# Patient Record
Sex: Female | Born: 1950 | Race: White | Hispanic: No | Marital: Married | Smoking: Never smoker
Health system: Southern US, Community
[De-identification: ages and names within clinical notes are randomized; demographics above are authoritative.]

## PROBLEM LIST (undated history)

## (undated) DIAGNOSIS — Z87442 Personal history of urinary calculi: Secondary | ICD-10-CM

## (undated) DIAGNOSIS — K219 Gastro-esophageal reflux disease without esophagitis: Secondary | ICD-10-CM

## (undated) DIAGNOSIS — I10 Essential (primary) hypertension: Secondary | ICD-10-CM

## (undated) DIAGNOSIS — J45909 Unspecified asthma, uncomplicated: Secondary | ICD-10-CM

## (undated) DIAGNOSIS — F419 Anxiety disorder, unspecified: Secondary | ICD-10-CM

## (undated) HISTORY — PX: APPENDECTOMY: SHX54

## (undated) HISTORY — PX: TONSILLECTOMY: SUR1361

---

## 1982-11-10 HISTORY — PX: TUBAL LIGATION: SHX77

## 2016-06-03 ENCOUNTER — Encounter (INDEPENDENT_AMBULATORY_CARE_PROVIDER_SITE_OTHER): Payer: Self-pay | Admitting: *Deleted

## 2016-06-18 ENCOUNTER — Encounter (INDEPENDENT_AMBULATORY_CARE_PROVIDER_SITE_OTHER): Payer: Self-pay | Admitting: *Deleted

## 2016-06-19 ENCOUNTER — Other Ambulatory Visit (INDEPENDENT_AMBULATORY_CARE_PROVIDER_SITE_OTHER): Payer: Self-pay | Admitting: *Deleted

## 2016-06-19 DIAGNOSIS — Z800 Family history of malignant neoplasm of digestive organs: Secondary | ICD-10-CM

## 2016-09-08 ENCOUNTER — Telehealth (INDEPENDENT_AMBULATORY_CARE_PROVIDER_SITE_OTHER): Payer: Self-pay | Admitting: *Deleted

## 2016-09-08 ENCOUNTER — Encounter (INDEPENDENT_AMBULATORY_CARE_PROVIDER_SITE_OTHER): Payer: Self-pay | Admitting: *Deleted

## 2016-09-08 MED ORDER — PEG 3350-KCL-NA BICARB-NACL 420 G PO SOLR: 4000.0000 mL | Freq: Once | ORAL | 0 refills | Status: AC

## 2016-09-08 NOTE — Telephone Encounter (Signed)
agree

## 2016-09-08 NOTE — Telephone Encounter (Signed)
Patient needs trilyte 

## 2016-09-08 NOTE — Telephone Encounter (Signed)
Referring MD/PCP: pomposini   Procedure: tcs  Reason/Indication:  Fam hx colon ca  Has patient had this procedure before?  Yes, 5 yrs ago  If so, when, by whom and where?    Is there a family history of colon cancer?  Yes, father  Who?  What age when diagnosed?    Is patient diabetic?   no      Does patient have prosthetic heart valve or mechanical valve?  no  Do you have a pacemaker?  no  Has patient ever had endocarditis? no  Has patient had joint replacement within last 12 months?  no  Does patient tend to be constipated or take laxatives? no  Does patient have a history of alcohol/drug use?  no  Is patient on Coumadin, Plavix and/or Aspirin? no  Medications: omeprazole 20 mg daily, multi vit  Allergies: sulfur drugs  Medication Adjustment:   Procedure date & time: 10/08/16 at 930

## 2016-10-08 ENCOUNTER — Encounter (HOSPITAL_COMMUNITY): Payer: Self-pay | Admitting: *Deleted

## 2016-10-08 ENCOUNTER — Ambulatory Visit (HOSPITAL_COMMUNITY)
Admission: RE | Admit: 2016-10-08 | Discharge: 2016-10-08 | Disposition: A | Discharge status: Home / Self Care | Payer: BLUE CROSS/BLUE SHIELD | Source: Ambulatory Visit | Attending: Internal Medicine | Admitting: Internal Medicine

## 2016-10-08 ENCOUNTER — Encounter (HOSPITAL_COMMUNITY)
Admission: RE | Disposition: A | Discharge status: Home / Self Care | Payer: Self-pay | Source: Ambulatory Visit | Attending: Internal Medicine

## 2016-10-08 DIAGNOSIS — Z1211 Encounter for screening for malignant neoplasm of colon: Secondary | ICD-10-CM | POA: Insufficient documentation

## 2016-10-08 DIAGNOSIS — Z800 Family history of malignant neoplasm of digestive organs: Secondary | ICD-10-CM | POA: Insufficient documentation

## 2016-10-08 DIAGNOSIS — Z79899 Other long term (current) drug therapy: Secondary | ICD-10-CM | POA: Diagnosis not present

## 2016-10-08 HISTORY — PX: COLONOSCOPY: SHX5424

## 2016-10-08 SURGERY — COLONOSCOPY
Anesthesia: Moderate Sedation

## 2016-10-08 MED ORDER — MEPERIDINE HCL 50 MG/ML IJ SOLN
INTRAMUSCULAR | Status: DC | PRN
  Administered 2016-10-08 (×4): 25 mg via INTRAVENOUS

## 2016-10-08 MED ORDER — MEPERIDINE HCL 50 MG/ML IJ SOLN
INTRAMUSCULAR | Status: DC
Start: 2016-10-08 — End: 2016-10-08
  Filled 2016-10-08: qty 1

## 2016-10-08 MED ORDER — MEPERIDINE HCL 50 MG/ML IJ SOLN
INTRAMUSCULAR | Status: AC
  Filled 2016-10-08: qty 1

## 2016-10-08 MED ORDER — MIDAZOLAM HCL 5 MG/5ML IJ SOLN
INTRAMUSCULAR | Status: AC
  Filled 2016-10-08: qty 10

## 2016-10-08 MED ORDER — MIDAZOLAM HCL 5 MG/5ML IJ SOLN
INTRAMUSCULAR | Status: DC | PRN
  Administered 2016-10-08 (×5): 2 mg via INTRAVENOUS

## 2016-10-08 MED ORDER — SODIUM CHLORIDE 0.9 % IV SOLN
INTRAVENOUS | Status: DC
  Administered 2016-10-08: 1000 mL via INTRAVENOUS

## 2016-10-08 NOTE — Discharge Instructions (Signed)
Resume usual medications and diet. °No driving for 24 hours. °Next colonoscopy in 5 years. ° ° ° ° ° ° °Colonoscopy, Adult, Care After °This sheet gives you information about how to care for yourself after your procedure. Your doctor may also give you more specific instructions. If you have problems or questions, call your doctor. °Follow these instructions at home: °General instructions ° °· For the first 24 hours after the procedure: °? Do not drive or use machinery. °? Do not sign important documents. °? Do not drink alcohol. °? Do your daily activities more slowly than normal. °? Eat foods that are soft and easy to digest. °? Rest often. °· Take over-the-counter or prescription medicines only as told by your doctor. °· It is up to you to get the results of your procedure. Ask your doctor, or the department performing the procedure, when your results will be ready. °To help cramping and bloating: °· Try walking around. °· Put heat on your belly (abdomen) as told by your doctor. Use a heat source that your doctor recommends, such as a moist heat pack or a heating pad. °? Put a towel between your skin and the heat source. °? Leave the heat on for 20-30 minutes. °? Remove the heat if your skin turns bright red. This is especially important if you cannot feel pain, heat, or cold. You can get burned. °Eating and drinking °· Drink enough fluid to keep your pee (urine) clear or pale yellow. °· Return to your normal diet as told by your doctor. Avoid heavy or fried foods that are hard to digest. °· Avoid drinking alcohol for as long as told by your doctor. °Contact a doctor if: °· You have blood in your poop (stool) 2-3 days after the procedure. °Get help right away if: °· You have more than a small amount of blood in your poop. °· You see large clumps of tissue (blood clots) in your poop. °· Your belly is swollen. °· You feel sick to your stomach (nauseous). °· You throw up (vomit). °· You have a fever. °· You have  belly pain that gets worse, and medicine does not help your pain. °This information is not intended to replace advice given to you by your health care provider. Make sure you discuss any questions you have with your health care provider. °Document Released: 11/29/2010 Document Revised: 07/21/2016 Document Reviewed: 07/21/2016 °Elsevier Interactive Patient Education © 2017 [employer] ° °

## 2016-10-08 NOTE — Op Note (Signed)
[facility] Patient Name: Caroline May Procedure Date: 10/08/2016 9:12 AM MRN: [id number] Date of Birth: Feb 08, 1951 Attending MD: Hildred Laser , MD CSN: [id number] Age: 65 Admit Type: Outpatient Procedure:                Colonoscopy Indications:              Screening patient at increased risk: Family history                            of 1st-degree relative with colorectal cancer at                            age 70 years (or older) Providers:                Hildred Laser, MD, Lurline Del, RN, Purcell Nails. [place],                            [employer] officer Referring MD:             Cherly Anderson. Pomposini, MD Medicines:                Meperidine 100 mg IV, Midazolam 10 mg IV Complications:            No immediate complications. Estimated Blood Loss:     Estimated blood loss: none. Procedure:                Pre-Anesthesia Assessment:                           - Prior to the procedure, a History and Physical                            was performed, and patient medications and                            allergies were reviewed. The patient's tolerance of                            previous anesthesia was also reviewed. The risks                            and benefits of the procedure and the sedation                            options and risks were discussed with the patient.                            All questions were answered, and informed consent                            was obtained. Prior Anticoagulants: The patient has                            taken no previous anticoagulant or antiplatelet  agents. ASA Grade Assessment: I - A normal, healthy                            patient. After reviewing the risks and benefits,                            the patient was deemed in satisfactory condition to                            undergo the procedure.                           After obtaining informed consent, the colonoscope                            was passed  under direct vision. Throughout the                            procedure, the patient's blood pressure, pulse, and                            oxygen saturations were monitored continuously. The                            [id number]) scope was introduced through                            the anus and advanced to the the cecum, identified                            by appendiceal orifice and ileocecal valve. The                            colonoscopy was technically difficult and complex                            due to significant looping and a tortuous colon.                            Successful completion of the procedure was aided by                            increasing the dose of sedation medication,                            changing the patient to a supine position, using                            manual pressure and withdrawing the scope and                            replacing with the 'babyscope'. The patient  tolerated the procedure well. The quality of the                            bowel preparation was good. The ileocecal valve,                            appendiceal orifice, and rectum were photographed. Scope In: 9:30:01 AM Scope Out: 10:27:36 AM Scope Withdrawal Time: 0 hours 6 minutes 14 seconds  Total Procedure Duration: 0 hours 57 minutes 35 seconds  Findings:      The perianal and digital rectal examinations were normal.      The colon (entire examined portion) appeared normal.      The retroflexed view of the distal rectum and anal verge was normal and       showed no anal or rectal abnormalities. Impression:               - The entire examined colon is normal.                           - No specimens collected. Moderate Sedation:      Moderate (conscious) sedation was administered by the endoscopy nurse       and supervised by the endoscopist. The following parameters were       monitored: oxygen saturation, heart rate,  blood pressure, CO2       capnography and response to care. Total physician intraservice time was       67 minutes. Recommendation:           - Patient has a contact number available for                            emergencies. The signs and symptoms of potential                            delayed complications were discussed with the                            patient. Return to normal activities tomorrow.                            Written discharge instructions were provided to the                            patient.                           - Resume previous diet today.                           - Continue present medications.                           - Repeat colonoscopy in 5 years for screening                            purposes.                           -  will use [facility] for next procedure. Procedure Code(s):        --- Professional ---                           [phone], Colonoscopy, flexible; diagnostic, including                            collection of specimen(s) by brushing or washing,                            when performed (separate procedure)                           99152, Moderate sedation services provided by the                            same physician or other qualified health care                            professional performing the diagnostic or                            therapeutic service that the sedation supports,                            requiring the presence of an independent trained                            observer to assist in the monitoring of the                            patient's level of consciousness and physiological                            status; initial 15 minutes of intraservice time,                            patient age 36 years or older                           [phone], Moderate sedation services; each additional                            15 minutes intraservice time                           99153, Moderate sedation  services; each additional                            15 minutes intraservice time                           99153, Moderate sedation services; each additional  15 minutes intraservice time Diagnosis Code(s):        --- Professional ---                           Z80.0, Family history of malignant neoplasm of                            digestive organs CPT copyright 2016 American Medical Association. All rights reserved. The codes documented in this report are preliminary and upon coder review may  be revised to meet current compliance requirements. Hildred Laser, MD Hildred Laser, MD 10/08/2016 10:36:45 AM This report has been signed electronically. Number of Addenda: 0

## 2016-10-08 NOTE — H&P (Signed)
Caroline May is an 65 y.o. female.   Chief Complaint: Patient is here for colonoscopy. HPI: Patient is 65 year old Caucasian female was a fire screening colonoscopy. Her stool colonoscopy 7 normal. Last exam was 5 years ago. She denies rectal bleeding. She has occasional fleeting pain at [facility] and wonders if she has diverticulosis. She has never been treated for diverticulitis. Her brother had 1 polyp removed. Father was diagnosed with CRC in his early 38s and died of MI at age 22.  History reviewed. No pertinent past medical history.  Past Surgical History:  Procedure Laterality Date  . TONSILLECTOMY    . TUBAL LIGATION  1984    Family History  Problem Relation Age of Onset  . Heart attack Mother   . Colon cancer Father   . Heart attack Father   . Breast cancer Sister   . Alzheimer's disease Sister   . Parkinson's disease Brother    Social History:  reports that she has never smoked. She has never used smokeless tobacco. She reports that she drinks alcohol. She reports that she does not use drugs.  Allergies:  Allergies  Allergen Reactions  . Sulfa Antibiotics Other (See Comments)    Causes the inside of mouth to become irritated     Medications Prior to Admission  Medication Sig Dispense Refill  . Calcium Carb-Cholecalciferol (CALCIUM 600 + D PO) Take 1 tablet by mouth daily.    . cetirizine (ZYRTEC) 10 MG tablet Take 10 mg by mouth daily as needed for allergies.    . Cholecalciferol (VITAMIN D) 2000 units tablet Take 2,000 Units by mouth daily.    . Multiple Vitamins-Minerals (MULTIVITAMIN WITH MINERALS) tablet Take 1 tablet by mouth daily.    . Omega-3 Krill Oil 1000 MG CAPS Take 1 capsule by mouth daily.    Marland Kitchen omeprazole (PRILOSEC) 20 MG capsule Take 20 mg by mouth daily.    . Probiotic Product (PROBIOTIC DAILY PO) Take 1 capsule by mouth daily.      No results found for this or any previous visit (from the past 48 hour(s)). No results found.  ROS  Blood pressure (!)  141/83, pulse 88, temperature 97.9 F (36.6 C), temperature source Oral, resp. rate 19, height 5\' 5"  (1.651 m), weight 138 lb (62.6 kg), SpO2 96 %. Physical Exam  Constitutional: She appears well-developed and well-nourished.  HENT:  Mouth/Throat: Oropharynx is clear and moist.  Eyes: Conjunctivae are normal. No scleral icterus.  Neck: No thyromegaly present.  Cardiovascular: Normal rate, regular rhythm and normal heart sounds.   No murmur heard. Respiratory: Effort normal and breath sounds normal.  GI: Soft. She exhibits no distension and no mass. There is no tenderness.  Musculoskeletal: She exhibits no edema.  Lymphadenopathy:    She has no cervical adenopathy.  Neurological: She is alert.  Skin: Skin is warm and dry.     Assessment/Plan High risk screening colonoscopy.  Hildred Laser, MD 10/08/2016, 9:17 AM

## 2016-10-10 ENCOUNTER — Encounter (HOSPITAL_COMMUNITY): Payer: Self-pay | Admitting: Internal Medicine

## 2020-12-24 ENCOUNTER — Encounter (INDEPENDENT_AMBULATORY_CARE_PROVIDER_SITE_OTHER): Payer: Self-pay | Admitting: *Deleted

## 2021-03-21 ENCOUNTER — Ambulatory Visit (INDEPENDENT_AMBULATORY_CARE_PROVIDER_SITE_OTHER): Payer: Medicare Other | Admitting: Gastroenterology

## 2021-05-09 ENCOUNTER — Other Ambulatory Visit: Payer: Self-pay

## 2021-05-09 ENCOUNTER — Encounter (INDEPENDENT_AMBULATORY_CARE_PROVIDER_SITE_OTHER): Payer: Self-pay | Admitting: Gastroenterology

## 2021-05-09 ENCOUNTER — Ambulatory Visit (INDEPENDENT_AMBULATORY_CARE_PROVIDER_SITE_OTHER): Payer: Medicare Other | Admitting: Gastroenterology

## 2021-05-09 VITALS — BP 118/71 | HR 71 | Temp 98.8°F | Ht 65.0 in | Wt 141.0 lb

## 2021-05-09 DIAGNOSIS — K5904 Chronic idiopathic constipation: Secondary | ICD-10-CM | POA: Diagnosis not present

## 2021-05-09 DIAGNOSIS — K5900 Constipation, unspecified: Secondary | ICD-10-CM | POA: Insufficient documentation

## 2021-05-09 DIAGNOSIS — Z800 Family history of malignant neoplasm of digestive organs: Secondary | ICD-10-CM

## 2021-05-09 NOTE — Progress Notes (Signed)
[name], M.D. Gastroenterology & Hepatology [facility] [address] [place], [place] [address] Primary Care Physician: Pomposini, Cherly Anderson, MD No address on file  Referring MD: PCP  Chief Complaint: Constipation and abdominal discomfort  History of Present Illness: Caroline May is a 70 y.o. female with past medical history of hypertension and urolithiasis, who presents for evaluation of constipation and abdominal discomfort.  The patient reports that she has a BM every 1-2 days. She was previously taking probiotics daily to have a bowel movement. She denies having abdominal pain in her abdomen, but states that she gets early satiety when she gets consitpated. She drinks water frequently. She eats prunes as needed when she is constipated, which makes her have a BM next day. Denies taking laxatives.  She reported she was having some discomfort 6 months ago in her abdomen but that has improved.  She is currently having more regular bowel movements after implementing increase water intake, is no longer having abdominal discomfort.  Patient reports that as part of the evaluation of urinary stones, she had some CT scans and was told she had significant stool in her bowel.  This made her concerned as she did not know if this was something she had to get treatment for.  States that she took some probiotics to improve her bowel movements but did not notice any difference.  The patient denies having any nausea, vomiting, fever, chills, hematochezia, melena, hematemesis, abdominal distention, diarrhea, jaundice, pruritus or weight loss.  I reviewed some investigations performed by her PCP in the past which included an abdominal ultrasound performed on 11/14/2020 which showed nonobstructive calculus in the left kidney without any other alterations.  She had blood testing performed 12/17/2020 which showed a normal BMP with a glucose of 121 but  otherwise her renal function and electrolytes were within normal limits.  Last EGD: Never Last Colonoscopy:2017 - normal  FHx: neg for any gastrointestinal/liver disease,  Patient states her father had colon cancer in his 59s, had only colonic resection. Social: neg smoking, alcohol or illicit drug use Surgical: no abdominal surgeries  Past Medical History:History reviewed. No pertinent past medical history.  Past Surgical History: Past Surgical History:  Procedure Laterality Date   COLONOSCOPY N/A 10/08/2016   Procedure: COLONOSCOPY;  Surgeon: Rogene Houston, MD;  Location: [facility];  Service: Endoscopy;  Laterality: N/A;  13   TONSILLECTOMY     TUBAL LIGATION  1984    Family History: Family History  Problem Relation Age of Onset   Heart attack Mother    Colon cancer Father    Heart attack Father    Breast cancer Sister    Alzheimer's disease Sister    Parkinson's disease Brother     Social History: Social History   Tobacco Use  Smoking Status Never  Smokeless Tobacco Never   Social History   Substance and Sexual Activity  Alcohol Use Yes   Comment: occasional glass of wine   Social History   Substance and Sexual Activity  Drug Use No    Allergies: Allergies  Allergen Reactions   Sulfa Antibiotics Other (See Comments)    Causes the inside of mouth to become irritated     Medications: Current Outpatient Medications  Medication Sig Dispense Refill   atorvastatin (LIPITOR) 20 MG tablet Take 20 mg by mouth daily.     Biotin 1000 MCG CHEW Chew 2,500 mg by mouth daily at 6 (six) AM.  cetirizine (ZYRTEC) 10 MG tablet Take 10 mg by mouth daily as needed for allergies.     Cholecalciferol (VITAMIN D) 2000 units tablet Take 2,000 Units by mouth daily.     losartan (COZAAR) 25 MG tablet Take 50 mg by mouth at bedtime.     Multiple Vitamins-Minerals (MULTIVITAMIN WITH MINERALS) tablet Take 1 tablet by mouth daily.     omeprazole (PRILOSEC) 20 MG  capsule Take 20 mg by mouth daily.     Probiotic Product (PROBIOTIC DAILY PO) Take 1 capsule by mouth daily. (Patient not taking: Reported on 05/09/2021)     No current facility-administered medications for this visit.    Review of Systems: GENERAL: negative for malaise, night sweats HEENT: No changes in hearing or vision, no nose bleeds or other nasal problems. NECK: Negative for lumps, goiter, pain and significant neck swelling RESPIRATORY: Negative for cough, wheezing CARDIOVASCULAR: Negative for chest pain, leg swelling, palpitations, orthopnea GI: SEE HPI MUSCULOSKELETAL: Negative for joint pain or swelling, back pain, and muscle pain. SKIN: Negative for lesions, rash PSYCH: Negative for sleep disturbance, mood disorder and recent psychosocial stressors. HEMATOLOGY Negative for prolonged bleeding, bruising easily, and swollen nodes. ENDOCRINE: Negative for cold or heat intolerance, polyuria, polydipsia and goiter. NEURO: negative for tremor, gait imbalance, syncope and seizures. The remainder of the review of systems is noncontributory.   Physical Exam: BP 118/71 (BP Location: Right Arm, Patient Position: Sitting, Cuff Size: Small)   Pulse 71   Temp 98.8 F (37.1 C) (Oral)   Ht 5\' 5"  (1.651 m)   Wt 141 lb (64 kg)   BMI 23.46 kg/m  GENERAL: The patient is AO x3, in no acute distress. HEENT: Head is normocephalic and atraumatic. EOMI are intact. Mouth is well hydrated and without lesions. NECK: Supple. No masses LUNGS: Clear to auscultation. No presence of rhonchi/wheezing/rales. Adequate chest expansion HEART: RRR, normal s1 and s2. ABDOMEN: Soft, nontender, no guarding, no peritoneal signs, and nondistended. BS +. No masses. EXTREMITIES: Without any cyanosis, clubbing, rash, lesions or edema. NEUROLOGIC: AOx3, no focal motor deficit. SKIN: no jaundice, no rashes   Imaging/Labs: as above  I personally reviewed and interpreted the available labs, imaging and endoscopic  files.  Impression and Plan: Caroline May is a 70 y.o. female with past medical history of hypertension and urolithiasis, who presents for evaluation of constipation and abdominal discomfort.  The patient has presented chronic issues with constipation that fortunately have improved with dietary changes.  I encouraged her to take dietary fiber such as prunes or kiwi which will help her have more regular bowel movements.  Has not present any other significant symptoms.  Had normal abdominal imaging in the last 6 months.  Given her family history of colon cancer, she will need to have repeat colonoscopy in December 2022.  - Schedule colonoscopy in December 2022 - Eat prune and/or kiwi daily  All questions were answered.      [name], MD Gastroenterology and Hepatology [facility]

## 2021-05-09 NOTE — Patient Instructions (Signed)
Schedule colonoscopy in December 2022 Eat prune and/or kiwi daily

## 2021-05-23 ENCOUNTER — Encounter (INDEPENDENT_AMBULATORY_CARE_PROVIDER_SITE_OTHER): Payer: Self-pay

## 2021-09-04 ENCOUNTER — Encounter (INDEPENDENT_AMBULATORY_CARE_PROVIDER_SITE_OTHER): Payer: Self-pay | Admitting: *Deleted

## 2021-10-01 ENCOUNTER — Other Ambulatory Visit (INDEPENDENT_AMBULATORY_CARE_PROVIDER_SITE_OTHER): Payer: Self-pay

## 2021-10-01 DIAGNOSIS — Z1211 Encounter for screening for malignant neoplasm of colon: Secondary | ICD-10-CM

## 2021-10-01 DIAGNOSIS — Z800 Family history of malignant neoplasm of digestive organs: Secondary | ICD-10-CM

## 2021-10-08 ENCOUNTER — Telehealth (INDEPENDENT_AMBULATORY_CARE_PROVIDER_SITE_OTHER): Payer: Self-pay

## 2021-10-08 ENCOUNTER — Encounter (INDEPENDENT_AMBULATORY_CARE_PROVIDER_SITE_OTHER): Payer: Self-pay

## 2021-10-08 MED ORDER — PEG 3350-KCL-NA BICARB-NACL 420 G PO SOLR: 4000.0000 mL | ORAL | 0 refills | Status: DC

## 2021-10-08 NOTE — Telephone Encounter (Signed)
Referring MD/PCP: Pomposini  Procedure: Tcs  Reason/Indication:  Screening, Fam Hx of Colon Cancer  Has patient had this procedure before?  Yes   If so, when, by whom and where?  09/2016  Is there a family history of colon cancer?  yes  Who?  What age when diagnosed?  Father  Is patient diabetic? If yes, Type 1 or Type 2   no      Does patient have prosthetic heart valve or mechanical valve?  no  Do you have a pacemaker/defibrillator?  no  Has patient ever had endocarditis/atrial fibrillation? no  Does patient use oxygen? no  Has patient had joint replacement within last 12 months?  no  Is patient constipated or do they take laxatives? no  Does patient have a history of alcohol/drug use?  no  Have you had a stroke/heart attack last 6 mths? no  Do you take medicine for weight loss?  no  For female patients,: do you still have your menstrual cycle? no  Is patient on blood thinner such as Coumadin, Plavix and/or Aspirin? no  Medications: losartan 75 mg daily, atorvastatin 20 mg daily, omeprazole 20 mg daily, MVI daily, krill oil 350 mg daily, Vit D 3 daily, Hair/Skin/nails daily  Allergies: sulfur antibiotics  Medication Adjustment per Dr Rehman/ none  Procedure date & time: Wednesday 11/06/21 8:30 am

## 2021-10-08 NOTE — Telephone Encounter (Signed)
Caroline May, CMA  

## 2021-10-29 NOTE — Patient Instructions (Signed)
Caroline May  10/29/2021     @PREFPERIOPPHARMACY @   Your procedure is scheduled on  11/06/2021.   Report to [facility] at  [phone]  A.M.   Call this number if you have problems the morning of surgery:  [phone]   Remember:  Follow the diet and prep instructions given to you by the office.    Take these medicines the morning of surgery with A SIP OF WATER                                        Prilosec.     Do not wear jewelry, make-up or nail polish.  Do not wear lotions, powders, or perfumes, or deodorant.  Do not shave 48 hours prior to surgery.  Men may shave face and neck.  Do not bring valuables to the hospital.  [facility] is not responsible for any belongings or valuables.  Contacts, dentures or bridgework may not be worn into surgery.  Leave your suitcase in the car.  After surgery it may be brought to your room.  For patients admitted to the hospital, discharge time will be determined by your treatment team.  Patients discharged the day of surgery will not be allowed to drive home and must have someone with them for 24 hours.    Special instructions:   DO NOT smoke tobacco or vape for 24 hours before your procedure.  Please read over the following fact sheets that you were given. Anesthesia Post-op Instructions and Care and Recovery After Surgery      Colonoscopy, Adult, Care After This sheet gives you information about how to care for yourself after your procedure. Your health care provider may also give you more specific instructions. If you have problems or questions, contact your health care provider. What can I expect after the procedure? After the procedure, it is common to have: A small amount of blood in your stool for 24 hours after the procedure. Some gas. Mild cramping or bloating of your abdomen. Follow these instructions at home: Eating and drinking  Drink enough fluid to keep your urine pale yellow. Follow instructions from your  health care provider about eating or drinking restrictions. Resume your normal diet as instructed by your health care provider. Avoid heavy or fried foods that are hard to digest. Activity Rest as told by your health care provider. Avoid sitting for a long time without moving. Get up to take short walks every 1-2 hours. This is important to improve blood flow and breathing. Ask for help if you feel weak or unsteady. Return to your normal activities as told by your health care provider. Ask your health care provider what activities are safe for you. Managing cramping and bloating  Try walking around when you have cramps or feel bloated. Apply heat to your abdomen as told by your health care provider. Use the heat source that your health care provider recommends, such as a moist heat pack or a heating pad. Place a towel between your skin and the heat source. Leave the heat on for 20-30 minutes. Remove the heat if your skin turns bright red. This is especially important if you are unable to feel pain, heat, or cold. You may have a greater risk of getting burned. General instructions If you were given a sedative during the procedure, it can affect you for  several hours. Do not drive or operate machinery until your health care provider says that it is safe. For the first 24 hours after the procedure: Do not sign important documents. Do not drink alcohol. Do your regular daily activities at a slower pace than normal. Eat soft foods that are easy to digest. Take over-the-counter and prescription medicines only as told by your health care provider. Keep all follow-up visits as told by your health care provider. This is important. Contact a health care provider if: You have blood in your stool 2-3 days after the procedure. Get help right away if you have: More than a small spotting of blood in your stool. Large blood clots in your stool. Swelling of your abdomen. Nausea or vomiting. A  fever. Increasing pain in your abdomen that is not relieved with medicine. Summary After the procedure, it is common to have a small amount of blood in your stool. You may also have mild cramping and bloating of your abdomen. If you were given a sedative during the procedure, it can affect you for several hours. Do not drive or operate machinery until your health care provider says that it is safe. Get help right away if you have a lot of blood in your stool, nausea or vomiting, a fever, or increased pain in your abdomen. This information is not intended to replace advice given to you by your health care provider. Make sure you discuss any questions you have with your health care provider. Document Revised: 09/02/2019 Document Reviewed: 05/23/2019 Elsevier Patient Education  [place] After This sheet gives you information about how to care for yourself after your procedure. Your health care provider may also give you more specific instructions. If you have problems or questions, contact your health care provider. What can I expect after the procedure? After the procedure, it is common to have: Tiredness. Forgetfulness about what happened after the procedure. Impaired judgment for important decisions. Nausea or vomiting. Some difficulty with balance. Follow these instructions at home: For the time period you were told by your health care provider:   Rest as needed. Do not participate in activities where you could fall or become injured. Do not drive or use machinery. Do not drink alcohol. Do not take sleeping pills or medicines that cause drowsiness. Do not make important decisions or sign legal documents. Do not take care of children on your own. Eating and drinking Follow the diet that is recommended by your health care provider. Drink enough fluid to keep your urine pale yellow. If you vomit: Drink water, juice, or soup when you can drink  without vomiting. Make sure you have little or no nausea before eating solid foods. General instructions Have a responsible adult stay with you for the time you are told. It is important to have someone help care for you until you are awake and alert. Take over-the-counter and prescription medicines only as told by your health care provider. If you have sleep apnea, surgery and certain medicines can increase your risk for breathing problems. Follow instructions from your health care provider about wearing your sleep device: Anytime you are sleeping, including during daytime naps. While taking prescription pain medicines, sleeping medicines, or medicines that make you drowsy. Avoid smoking. Keep all follow-up visits as told by your health care provider. This is important. Contact a health care provider if: You keep feeling nauseous or you keep vomiting. You feel light-headed. You are still sleepy or having trouble  with balance after 24 hours. You develop a rash. You have a fever. You have redness or swelling around the IV site. Get help right away if: You have trouble breathing. You have new-onset confusion at home. Summary For several hours after your procedure, you may feel tired. You may also be forgetful and have poor judgment. Have a responsible adult stay with you for the time you are told. It is important to have someone help care for you until you are awake and alert. Rest as told. Do not drive or operate machinery. Do not drink alcohol or take sleeping pills. Get help right away if you have trouble breathing, or if you suddenly become confused. This information is not intended to replace advice given to you by your health care provider. Make sure you discuss any questions you have with your health care provider. Document Revised: 07/12/2020 Document Reviewed: 09/29/2019 Elsevier Patient Education  2022 [employer].

## 2021-10-31 ENCOUNTER — Encounter (HOSPITAL_COMMUNITY): Payer: Self-pay

## 2021-10-31 ENCOUNTER — Other Ambulatory Visit: Payer: Self-pay

## 2021-10-31 ENCOUNTER — Encounter (HOSPITAL_COMMUNITY)
Admission: RE | Admit: 2021-10-31 | Discharge: 2021-10-31 | Disposition: A | Discharge status: Home / Self Care | Payer: Medicare Other | Source: Ambulatory Visit | Attending: Internal Medicine | Admitting: Internal Medicine

## 2021-10-31 HISTORY — DX: Essential (primary) hypertension: I10

## 2021-10-31 HISTORY — DX: Personal history of urinary calculi: Z87.442

## 2021-10-31 HISTORY — DX: Unspecified asthma, uncomplicated: J45.909

## 2021-10-31 HISTORY — DX: Gastro-esophageal reflux disease without esophagitis: K21.9

## 2021-10-31 HISTORY — DX: Anxiety disorder, unspecified: F41.9

## 2021-11-18 ENCOUNTER — Encounter (HOSPITAL_COMMUNITY)
Admission: RE | Admit: 2021-11-18 | Discharge: 2021-11-18 | Disposition: A | Discharge status: Home / Self Care | Payer: Medicare Other | Source: Ambulatory Visit | Attending: Internal Medicine | Admitting: Internal Medicine

## 2021-11-20 ENCOUNTER — Ambulatory Visit (HOSPITAL_COMMUNITY): Admission: RE | Admit: 2021-11-20 | Payer: Medicare Other | Source: Ambulatory Visit | Admitting: Internal Medicine

## 2021-11-20 ENCOUNTER — Encounter (HOSPITAL_COMMUNITY): Admission: RE | Payer: Self-pay | Source: Ambulatory Visit

## 2021-11-20 SURGERY — COLONOSCOPY WITH PROPOFOL
Anesthesia: Monitor Anesthesia Care

## 2021-12-24 ENCOUNTER — Ambulatory Visit (INDEPENDENT_AMBULATORY_CARE_PROVIDER_SITE_OTHER): Payer: Medicare Other | Admitting: Gastroenterology

## 2021-12-24 ENCOUNTER — Other Ambulatory Visit: Payer: Self-pay

## 2021-12-24 ENCOUNTER — Encounter (INDEPENDENT_AMBULATORY_CARE_PROVIDER_SITE_OTHER): Payer: Self-pay | Admitting: Gastroenterology

## 2021-12-24 VITALS — BP 138/79 | HR 69 | Temp 98.1°F | Ht 65.0 in | Wt 142.7 lb

## 2021-12-24 DIAGNOSIS — K5900 Constipation, unspecified: Secondary | ICD-10-CM | POA: Diagnosis not present

## 2021-12-24 DIAGNOSIS — R1032 Left lower quadrant pain: Secondary | ICD-10-CM

## 2021-12-24 NOTE — Progress Notes (Signed)
Referring Provider: Pomposini, Cherly Anderson, MD Primary Care Physician:  Clinton Quant, MD Primary GI Physician: Jenetta Downer  Chief Complaint  Patient presents with   LLQ pain    Patient here today with complaints of LLQ pain, pain comes and goes. She has some issue with constipation. She eats prunes to help with this. She states since bp medication was increased she has started having the issues.    HPI:   Caroline May is a 71 y.o. female with past medical history of HTN, urolithiasis.   Patient presenting today for LLQ pain and constipation.   Last seen June 2022 for constipation, patient was set up for colonoscopy and advised to eat prune/kiwi daily. She was scheduled for colonoscopy in January 2023, however, had to cancel this due to being sick.   She states that she has had some issues with her BP since her acute illness in early January, had to have her BP medication increased. She states since meds were increased she has had worsening constipation. She is avoiding caffeine, eating a low sodium, low processed food diet. She states that she has been doing some sugar free creamers and drinks with sugar substitutes and questions if these may be causing some of her GI symptoms, though she is without diarrhea. She states that she typically has a BM everyday, however, if she goes a day without having a BM, she will eat some prunes which seem to help. She does have to strain sometimes to have a BM as stools continue to feel hard. She has some intermittent LLQ pain, usually worse during times of worse constipation. She tries to eat a diet high in fruits and veggies. She does drink plenty of water, 4 bottles per day at minimum and gets plenty of exercise throughout the week with scheduled exercise classes. She denies rectal bleeding, melena, nausea, vomiting, weight loss, changes in appetite, reflux symptoms, dysphagia or odynophagia.   Last Colonoscopy:2017 normal  Last  Endoscopy:never  Recommendations:  Needs repeat colonoscopy   Past Medical History:  Diagnosis Date   Anxiety    Asthma    GERD (gastroesophageal reflux disease)    History of kidney stones    Hypertension     Past Surgical History:  Procedure Laterality Date   APPENDECTOMY     COLONOSCOPY N/A 10/08/2016   Procedure: COLONOSCOPY;  Surgeon: Rogene Houston, MD;  Location: [facility];  Service: Endoscopy;  Laterality: N/A;  [place]    Current Outpatient Medications  Medication Sig Dispense Refill   atorvastatin (LIPITOR) 20 MG tablet Take 20 mg by mouth daily.     cetirizine (ZYRTEC) 10 MG tablet Take 10 mg by mouth daily as needed for allergies.     Cholecalciferol (VITAMIN D) 2000 units tablet Take 2,000 Units by mouth daily.     ketoconazole (NIZORAL) 2 % cream Apply 1 application topically daily.     Krill Oil 350 MG CAPS Take 350 mg by mouth daily.     LORazepam (ATIVAN) 0.5 MG tablet Take 0.5 mg by mouth as needed for anxiety.     losartan (COZAAR) 100 MG tablet Take 100 mg by mouth daily.     Multiple Vitamins-Minerals (MULTIVITAMIN WITH MINERALS) tablet Take 1 tablet by mouth daily.     omeprazole (PRILOSEC) 20 MG capsule Take 20 mg by mouth daily.     OVER THE COUNTER MEDICATION Hair Skin and nails once per  day     triamcinolone (NASACORT) 55 MCG/ACT AERO nasal inhaler Place 2 sprays into the nose daily as needed (allergies).     No current facility-administered medications for this visit.    Allergies as of 12/24/2021 - Review Complete 12/24/2021  Allergen Reaction Noted   Sulfa antibiotics Other (See Comments) 10/03/2016   Azithromycin Palpitations 10/23/2021    Family History  Problem Relation Age of Onset   Heart attack Mother    Colon cancer Father    Heart attack Father    Breast cancer Sister    Alzheimer's disease Sister    Parkinson's disease Brother     Social History   Socioeconomic History   Marital  status: Married    Spouse name: Not on file   Number of children: Not on file   Years of education: Not on file   Highest education level: Not on file  Occupational History   Not on file  Tobacco Use   Smoking status: Never   Smokeless tobacco: Never  Vaping Use   Vaping Use: Never used  Substance and Sexual Activity   Alcohol use: Yes    Comment: occasional glass of wine   Drug use: No   Sexual activity: Not on file  Other Topics Concern   Not on file  Social History Narrative   Not on file   Social Determinants of Health   Financial Resource Strain: Not on file  Food Insecurity: Not on file  Transportation Needs: Not on file  Physical Activity: Not on file  Stress: Not on file  Social Connections: Not on file   Review of systems General: negative for malaise, night sweats, fever, chills, weight loss Neck: Negative for lumps, goiter, pain and significant neck swelling Resp: Negative for cough, wheezing, dyspnea at rest CV: Negative for chest pain, leg swelling, palpitations, orthopnea GI: denies melena, hematochezia, nausea, vomiting, diarrhea, dysphagia, odyonophagia, early satiety or unintentional weight loss. +constipation +occasional LLQ pain MSK: Negative for joint pain or swelling, back pain, and muscle pain. Derm: Negative for itching or rash Psych: Denies depression, anxiety, memory loss, confusion. No homicidal or suicidal ideation.  Heme: Negative for prolonged bleeding, bruising easily, and swollen nodes. Endocrine: Negative for cold or heat intolerance, polyuria, polydipsia and goiter. Neuro: negative for tremor, gait imbalance, syncope and seizures. The remainder of the review of systems is noncontributory.  Physical Exam: BP 138/79 (BP Location: Left Arm, Patient Position: Sitting, Cuff Size: Large)    Pulse 69    Temp 98.1 F (36.7 C) (Oral)    Ht 5\' 5"  (1.651 m)    Wt 142 lb 11.2 oz (64.7 kg)    BMI 23.75 kg/m  General:   Alert and oriented. No  distress noted. Pleasant and cooperative.  Head:  Normocephalic and atraumatic. Eyes:  Conjuctiva clear without scleral icterus. Mouth:  Oral mucosa pink and moist. Good dentition. No lesions. Heart: Normal rate and rhythm, s1 and s2 heart sounds present.  Lungs: Clear lung sounds in all lobes. Respirations equal and unlabored. Abdomen:  +BS, soft, non-tender and non-distended. No rebound or guarding. No HSM or masses noted. Derm: No palmar erythema or jaundice Msk:  Symmetrical without gross deformities. Normal posture. Extremities:  Without edema. Neurologic:  Alert and  oriented x4 Psych:  Alert and cooperative. Normal mood and affect.  Invalid input(s): 6 MONTHS   ASSESSMENT: Caroline May is a 71 y.o. female presenting today for ongoing constipation and some occasional LLQ pain.  Patient continues  with constipation, having 1 BM per day usually but having to strain some due to hard stools. She feels that symptoms worsened after her losartan dosage was increased. Occasional LLQ pain, especially during times when she is having worse constipation, abdominal exam reassuringly is benign for any TTP. Patient is exercising and tries to eat a diet high in fruits and veggies. She is drinking plenty of water and eating prunes when she skips a day without a BM. She has no red flag symptoms. She has tried metamucil in the past which caused bloating. I discussed trying otc miralax, or even Rx Linzess or trulance, however, patient does not want to try anything at this time and prefers to attempt to manage her constipation with more natural remedies and diet. I encouraged her to keep a food journal in order to correlate GI symptoms to foods she has eaten. We discussed sugar substitutes and some food additives can certainly cause some unwanted GI symptoms, though typically these would be abdominal cramping/diarrhea. I have also provided low FODMAP diet for her to try as it is possible that she has some  underlying IBS contributing to her constipation. All questions were answered, patient verbalized understanding and is in agreement with plan as outline above.   We will also get her rescheduled for colonoscopy as her BP appears to be stabilized now, however, if she begins having issues with HTN again prior to procedure, this will need to be postponed.   Indications, risks and benefits of procedure discussed in detail with patient. Patient verbalized understanding and is in agreement to proceed with colonoscopy at this time.    PLAN:  Schedule colonoscopy  2. Ensure diet high in fruits, veggies, whole grains, and stay well hydrated 3. Continue prunes and kiwi 4. Can try probiotic lactobacillus rhamnosus 5. Low FODMAP diet 6. Keep food journal x2 weeks to correlate foods and symptoms    Follow Up: 6 months  Antania Hoefling L. Alver Sorrow, MSN, APRN, AGNP-C Adult-Gerontology Nurse Practitioner [facility]

## 2021-12-24 NOTE — Patient Instructions (Signed)
We will get you scheduled for colonoscopy Please continue to eat a diet high in fruits, veggies and whole grains Make sure you are drinking plenty of water, atleast 64 oz per day Continue with prunes or kiwi daily to help with constipation. You can try over the counter probiotic (lactobacillus rhamnosus) to see if this helps with some of your GI symptoms. If you are not having good results with dietary modifications, we may need to discuss trying something such as miralax or a medication for constipation such as trulance or linzess.  Please keep a food journal for the next 2 weeks to keep track of correlations between what you are eating/drinking and your symptoms. I have provided low FODMAP diet as well as this can be helpful to use when trying to cut out more processed foods and additives that can adversely affect your GI tract  Follow up 6 months

## 2021-12-24 NOTE — H&P (View-Only) (Signed)
Referring Provider: Pomposini, Cherly Anderson, MD Primary Care Physician:  Clinton Quant, MD Primary GI Physician: Jenetta Downer  Chief Complaint  Patient presents with   LLQ pain    Patient here today with complaints of LLQ pain, pain comes and goes. She has some issue with constipation. She eats prunes to help with this. She states since bp medication was increased she has started having the issues.    HPI:   Caroline May is a 71 y.o. female with past medical history of HTN, urolithiasis.   Patient presenting today for LLQ pain and constipation.   Last seen June 2022 for constipation, patient was set up for colonoscopy and advised to eat prune/kiwi daily. She was scheduled for colonoscopy in January 2023, however, had to cancel this due to being sick.   She states that she has had some issues with her BP since her acute illness in early January, had to have her BP medication increased. She states since meds were increased she has had worsening constipation. She is avoiding caffeine, eating a low sodium, low processed food diet. She states that she has been doing some sugar free creamers and drinks with sugar substitutes and questions if these may be causing some of her GI symptoms, though she is without diarrhea. She states that she typically has a BM everyday, however, if she goes a day without having a BM, she will eat some prunes which seem to help. She does have to strain sometimes to have a BM as stools continue to feel hard. She has some intermittent LLQ pain, usually worse during times of worse constipation. She tries to eat a diet high in fruits and veggies. She does drink plenty of water, 4 bottles per day at minimum and gets plenty of exercise throughout the week with scheduled exercise classes. She denies rectal bleeding, melena, nausea, vomiting, weight loss, changes in appetite, reflux symptoms, dysphagia or odynophagia.   Last Colonoscopy:2017 normal  Last  Endoscopy:never  Recommendations:  Needs repeat colonoscopy   Past Medical History:  Diagnosis Date   Anxiety    Asthma    GERD (gastroesophageal reflux disease)    History of kidney stones    Hypertension     Past Surgical History:  Procedure Laterality Date   APPENDECTOMY     COLONOSCOPY N/A 10/08/2016   Procedure: COLONOSCOPY;  Surgeon: Rogene Houston, MD;  Location: [facility];  Service: Endoscopy;  Laterality: N/A;  [place]    Current Outpatient Medications  Medication Sig Dispense Refill   atorvastatin (LIPITOR) 20 MG tablet Take 20 mg by mouth daily.     cetirizine (ZYRTEC) 10 MG tablet Take 10 mg by mouth daily as needed for allergies.     Cholecalciferol (VITAMIN D) 2000 units tablet Take 2,000 Units by mouth daily.     ketoconazole (NIZORAL) 2 % cream Apply 1 application topically daily.     Krill Oil 350 MG CAPS Take 350 mg by mouth daily.     LORazepam (ATIVAN) 0.5 MG tablet Take 0.5 mg by mouth as needed for anxiety.     losartan (COZAAR) 100 MG tablet Take 100 mg by mouth daily.     Multiple Vitamins-Minerals (MULTIVITAMIN WITH MINERALS) tablet Take 1 tablet by mouth daily.     omeprazole (PRILOSEC) 20 MG capsule Take 20 mg by mouth daily.     OVER THE COUNTER MEDICATION Hair Skin and nails once per  day     triamcinolone (NASACORT) 55 MCG/ACT AERO nasal inhaler Place 2 sprays into the nose daily as needed (allergies).     No current facility-administered medications for this visit.    Allergies as of 12/24/2021 - Review Complete 12/24/2021  Allergen Reaction Noted   Sulfa antibiotics Other (See Comments) 10/03/2016   Azithromycin Palpitations 10/23/2021    Family History  Problem Relation Age of Onset   Heart attack Mother    Colon cancer Father    Heart attack Father    Breast cancer Sister    Alzheimer's disease Sister    Parkinson's disease Brother     Social History   Socioeconomic History   Marital  status: Married    Spouse name: Not on file   Number of children: Not on file   Years of education: Not on file   Highest education level: Not on file  Occupational History   Not on file  Tobacco Use   Smoking status: Never   Smokeless tobacco: Never  Vaping Use   Vaping Use: Never used  Substance and Sexual Activity   Alcohol use: Yes    Comment: occasional glass of wine   Drug use: No   Sexual activity: Not on file  Other Topics Concern   Not on file  Social History Narrative   Not on file   Social Determinants of Health   Financial Resource Strain: Not on file  Food Insecurity: Not on file  Transportation Needs: Not on file  Physical Activity: Not on file  Stress: Not on file  Social Connections: Not on file   Review of systems General: negative for malaise, night sweats, fever, chills, weight loss Neck: Negative for lumps, goiter, pain and significant neck swelling Resp: Negative for cough, wheezing, dyspnea at rest CV: Negative for chest pain, leg swelling, palpitations, orthopnea GI: denies melena, hematochezia, nausea, vomiting, diarrhea, dysphagia, odyonophagia, early satiety or unintentional weight loss. +constipation +occasional LLQ pain MSK: Negative for joint pain or swelling, back pain, and muscle pain. Derm: Negative for itching or rash Psych: Denies depression, anxiety, memory loss, confusion. No homicidal or suicidal ideation.  Heme: Negative for prolonged bleeding, bruising easily, and swollen nodes. Endocrine: Negative for cold or heat intolerance, polyuria, polydipsia and goiter. Neuro: negative for tremor, gait imbalance, syncope and seizures. The remainder of the review of systems is noncontributory.  Physical Exam: BP 138/79 (BP Location: Left Arm, Patient Position: Sitting, Cuff Size: Large)    Pulse 69    Temp 98.1 F (36.7 C) (Oral)    Ht 5\' 5"  (1.651 m)    Wt 142 lb 11.2 oz (64.7 kg)    BMI 23.75 kg/m  General:   Alert and oriented. No  distress noted. Pleasant and cooperative.  Head:  Normocephalic and atraumatic. Eyes:  Conjuctiva clear without scleral icterus. Mouth:  Oral mucosa pink and moist. Good dentition. No lesions. Heart: Normal rate and rhythm, s1 and s2 heart sounds present.  Lungs: Clear lung sounds in all lobes. Respirations equal and unlabored. Abdomen:  +BS, soft, non-tender and non-distended. No rebound or guarding. No HSM or masses noted. Derm: No palmar erythema or jaundice Msk:  Symmetrical without gross deformities. Normal posture. Extremities:  Without edema. Neurologic:  Alert and  oriented x4 Psych:  Alert and cooperative. Normal mood and affect.  Invalid input(s): 6 MONTHS   ASSESSMENT: Caroline May is a 71 y.o. female presenting today for ongoing constipation and some occasional LLQ pain.  Patient continues  with constipation, having 1 BM per day usually but having to strain some due to hard stools. She feels that symptoms worsened after her losartan dosage was increased. Occasional LLQ pain, especially during times when she is having worse constipation, abdominal exam reassuringly is benign for any TTP. Patient is exercising and tries to eat a diet high in fruits and veggies. She is drinking plenty of water and eating prunes when she skips a day without a BM. She has no red flag symptoms. She has tried metamucil in the past which caused bloating. I discussed trying otc miralax, or even Rx Linzess or trulance, however, patient does not want to try anything at this time and prefers to attempt to manage her constipation with more natural remedies and diet. I encouraged her to keep a food journal in order to correlate GI symptoms to foods she has eaten. We discussed sugar substitutes and some food additives can certainly cause some unwanted GI symptoms, though typically these would be abdominal cramping/diarrhea. I have also provided low FODMAP diet for her to try as it is possible that she has some  underlying IBS contributing to her constipation. All questions were answered, patient verbalized understanding and is in agreement with plan as outline above.   We will also get her rescheduled for colonoscopy as her BP appears to be stabilized now, however, if she begins having issues with HTN again prior to procedure, this will need to be postponed.   Indications, risks and benefits of procedure discussed in detail with patient. Patient verbalized understanding and is in agreement to proceed with colonoscopy at this time.    PLAN:  Schedule colonoscopy  2. Ensure diet high in fruits, veggies, whole grains, and stay well hydrated 3. Continue prunes and kiwi 4. Can try probiotic lactobacillus rhamnosus 5. Low FODMAP diet 6. Keep food journal x2 weeks to correlate foods and symptoms    Follow Up: 6 months  Laquashia Mergenthaler L. Alver Sorrow, MSN, APRN, AGNP-C Adult-Gerontology Nurse Practitioner [facility]

## 2021-12-26 ENCOUNTER — Encounter (INDEPENDENT_AMBULATORY_CARE_PROVIDER_SITE_OTHER): Payer: Self-pay

## 2021-12-26 ENCOUNTER — Other Ambulatory Visit (INDEPENDENT_AMBULATORY_CARE_PROVIDER_SITE_OTHER): Payer: Self-pay

## 2021-12-26 DIAGNOSIS — Z1211 Encounter for screening for malignant neoplasm of colon: Secondary | ICD-10-CM

## 2021-12-30 NOTE — Patient Instructions (Signed)
Caroline May  12/30/2021     @PREFPERIOPPHARMACY @   Your procedure is scheduled on  01/03/2022.   Report to [facility] at  0600 A.M.   Call this number if you have problems the morning of surgery:  [phone]   Remember:  Follow the diet and prep instructions given to you by the office.    Take these medicines the morning of surgery with A SIP OF WATER                            ativan (if needed), prilosec.     Do not wear jewelry, make-up or nail polish.  Do not wear lotions, powders, or perfumes, or deodorant.  Do not shave 48 hours prior to surgery.  Men may shave face and neck.  Do not bring valuables to the hospital.  [facility] is not responsible for any belongings or valuables.  Contacts, dentures or bridgework may not be worn into surgery.  Leave your suitcase in the car.  After surgery it may be brought to your room.  For patients admitted to the hospital, discharge time will be determined by your treatment team.  Patients discharged the day of surgery will not be allowed to drive home and must have someone with them for 24 hours.    Special instructions:   DO NOT smoke tobacco or vape for 24 hours before your procedure.  Please read over the following fact sheets that you were given. Anesthesia Post-op Instructions and Care and Recovery After Surgery      Colonoscopy, Adult, Care After This sheet gives you information about how to care for yourself after your procedure. Your health care provider may also give you more specific instructions. If you have problems or questions, contact your health care provider. What can I expect after the procedure? After the procedure, it is common to have: A small amount of blood in your stool for 24 hours after the procedure. Some gas. Mild cramping or bloating of your abdomen. Follow these instructions at home: Eating and drinking  Drink enough fluid to keep your urine pale yellow. Follow instructions from  your health care provider about eating or drinking restrictions. Resume your normal diet as instructed by your health care provider. Avoid heavy or fried foods that are hard to digest. Activity Rest as told by your health care provider. Avoid sitting for a long time without moving. Get up to take short walks every 1-2 hours. This is important to improve blood flow and breathing. Ask for help if you feel weak or unsteady. Return to your normal activities as told by your health care provider. Ask your health care provider what activities are safe for you. Managing cramping and bloating  Try walking around when you have cramps or feel bloated. Apply heat to your abdomen as told by your health care provider. Use the heat source that your health care provider recommends, such as a moist heat pack or a heating pad. Place a towel between your skin and the heat source. Leave the heat on for 20-30 minutes. Remove the heat if your skin turns bright red. This is especially important if you are unable to feel pain, heat, or cold. You may have a greater risk of getting burned. General instructions If you were given a sedative during the procedure, it can affect you for several hours. Do not drive or operate machinery until your  health care provider says that it is safe. For the first 24 hours after the procedure: Do not sign important documents. Do not drink alcohol. Do your regular daily activities at a slower pace than normal. Eat soft foods that are easy to digest. Take over-the-counter and prescription medicines only as told by your health care provider. Keep all follow-up visits as told by your health care provider. This is important. Contact a health care provider if: You have blood in your stool 2-3 days after the procedure. Get help right away if you have: More than a small spotting of blood in your stool. Large blood clots in your stool. Swelling of your abdomen. Nausea or vomiting. A  fever. Increasing pain in your abdomen that is not relieved with medicine. Summary After the procedure, it is common to have a small amount of blood in your stool. You may also have mild cramping and bloating of your abdomen. If you were given a sedative during the procedure, it can affect you for several hours. Do not drive or operate machinery until your health care provider says that it is safe. Get help right away if you have a lot of blood in your stool, nausea or vomiting, a fever, or increased pain in your abdomen. This information is not intended to replace advice given to you by your health care provider. Make sure you discuss any questions you have with your health care provider. Document Revised: 09/02/2019 Document Reviewed: 05/23/2019 Elsevier Patient Education  [place] After This sheet gives you information about how to care for yourself after your procedure. Your health care provider may also give you more specific instructions. If you have problems or questions, contact your health care provider. What can I expect after the procedure? After the procedure, it is common to have: Tiredness. Forgetfulness about what happened after the procedure. Impaired judgment for important decisions. Nausea or vomiting. Some difficulty with balance. Follow these instructions at home: For the time period you were told by your health care provider:   Rest as needed. Do not participate in activities where you could fall or become injured. Do not drive or use machinery. Do not drink alcohol. Do not take sleeping pills or medicines that cause drowsiness. Do not make important decisions or sign legal documents. Do not take care of children on your own. Eating and drinking Follow the diet that is recommended by your health care provider. Drink enough fluid to keep your urine pale yellow. If you vomit: Drink water, juice, or soup when you can drink  without vomiting. Make sure you have little or no nausea before eating solid foods. General instructions Have a responsible adult stay with you for the time you are told. It is important to have someone help care for you until you are awake and alert. Take over-the-counter and prescription medicines only as told by your health care provider. If you have sleep apnea, surgery and certain medicines can increase your risk for breathing problems. Follow instructions from your health care provider about wearing your sleep device: Anytime you are sleeping, including during daytime naps. While taking prescription pain medicines, sleeping medicines, or medicines that make you drowsy. Avoid smoking. Keep all follow-up visits as told by your health care provider. This is important. Contact a health care provider if: You keep feeling nauseous or you keep vomiting. You feel light-headed. You are still sleepy or having trouble with balance after 24 hours. You develop a rash. You  have a fever. You have redness or swelling around the IV site. Get help right away if: You have trouble breathing. You have new-onset confusion at home. Summary For several hours after your procedure, you may feel tired. You may also be forgetful and have poor judgment. Have a responsible adult stay with you for the time you are told. It is important to have someone help care for you until you are awake and alert. Rest as told. Do not drive or operate machinery. Do not drink alcohol or take sleeping pills. Get help right away if you have trouble breathing, or if you suddenly become confused. This information is not intended to replace advice given to you by your health care provider. Make sure you discuss any questions you have with your health care provider. Document Revised: 07/12/2020 Document Reviewed: 09/29/2019 Elsevier Patient Education  2022 [employer].

## 2021-12-31 ENCOUNTER — Encounter (HOSPITAL_COMMUNITY)
Admission: RE | Admit: 2021-12-31 | Discharge: 2021-12-31 | Disposition: A | Discharge status: Home / Self Care | Payer: Medicare Other | Source: Ambulatory Visit | Attending: Gastroenterology | Admitting: Gastroenterology

## 2022-01-01 ENCOUNTER — Other Ambulatory Visit: Payer: Self-pay

## 2022-01-01 ENCOUNTER — Encounter (HOSPITAL_COMMUNITY): Payer: Self-pay

## 2022-01-03 ENCOUNTER — Ambulatory Visit (HOSPITAL_BASED_OUTPATIENT_CLINIC_OR_DEPARTMENT_OTHER): Payer: Medicare Other | Admitting: Anesthesiology

## 2022-01-03 ENCOUNTER — Ambulatory Visit (HOSPITAL_COMMUNITY): Payer: Medicare Other | Admitting: Anesthesiology

## 2022-01-03 ENCOUNTER — Encounter (HOSPITAL_COMMUNITY): Payer: Self-pay | Admitting: Gastroenterology

## 2022-01-03 ENCOUNTER — Ambulatory Visit (HOSPITAL_COMMUNITY)
Admission: RE | Admit: 2022-01-03 | Discharge: 2022-01-03 | Disposition: A | Discharge status: Home / Self Care | Payer: Medicare Other | Attending: Gastroenterology | Admitting: Gastroenterology

## 2022-01-03 ENCOUNTER — Encounter (HOSPITAL_COMMUNITY)
Admission: RE | Disposition: A | Discharge status: Home / Self Care | Payer: Self-pay | Source: Home / Self Care | Attending: Gastroenterology

## 2022-01-03 DIAGNOSIS — K648 Other hemorrhoids: Secondary | ICD-10-CM | POA: Diagnosis not present

## 2022-01-03 DIAGNOSIS — Z79899 Other long term (current) drug therapy: Secondary | ICD-10-CM | POA: Diagnosis not present

## 2022-01-03 DIAGNOSIS — Z87442 Personal history of urinary calculi: Secondary | ICD-10-CM | POA: Insufficient documentation

## 2022-01-03 DIAGNOSIS — K219 Gastro-esophageal reflux disease without esophagitis: Secondary | ICD-10-CM | POA: Insufficient documentation

## 2022-01-03 DIAGNOSIS — Z1211 Encounter for screening for malignant neoplasm of colon: Secondary | ICD-10-CM | POA: Insufficient documentation

## 2022-01-03 DIAGNOSIS — F419 Anxiety disorder, unspecified: Secondary | ICD-10-CM | POA: Insufficient documentation

## 2022-01-03 DIAGNOSIS — I10 Essential (primary) hypertension: Secondary | ICD-10-CM | POA: Insufficient documentation

## 2022-01-03 DIAGNOSIS — Z800 Family history of malignant neoplasm of digestive organs: Secondary | ICD-10-CM | POA: Diagnosis not present

## 2022-01-03 DIAGNOSIS — K5900 Constipation, unspecified: Secondary | ICD-10-CM | POA: Diagnosis not present

## 2022-01-03 DIAGNOSIS — J45909 Unspecified asthma, uncomplicated: Secondary | ICD-10-CM | POA: Diagnosis not present

## 2022-01-03 HISTORY — PX: COLONOSCOPY WITH PROPOFOL: SHX5780

## 2022-01-03 LAB — HM COLONOSCOPY

## 2022-01-03 SURGERY — COLONOSCOPY WITH PROPOFOL
Anesthesia: General

## 2022-01-03 MED ORDER — PROPOFOL 10 MG/ML IV BOLUS
INTRAVENOUS | Status: DC | PRN
  Administered 2022-01-03: 60 mg via INTRAVENOUS

## 2022-01-03 MED ORDER — LACTATED RINGERS IV SOLN: INTRAVENOUS | Status: DC

## 2022-01-03 MED ORDER — STERILE WATER FOR IRRIGATION IR SOLN
Status: DC | PRN
  Administered 2022-01-03: 120 mL

## 2022-01-03 MED ORDER — PROPOFOL 500 MG/50ML IV EMUL
INTRAVENOUS | Status: DC | PRN
  Administered 2022-01-03: 150 ug/kg/min via INTRAVENOUS

## 2022-01-03 NOTE — Transfer of Care (Signed)
Immediate Anesthesia Transfer of Care Note  Patient: Caroline May  Procedure(s) Performed: COLONOSCOPY WITH PROPOFOL  Patient Location: PACU  Anesthesia Type:General  Level of Consciousness: awake, alert  and oriented  Airway & Oxygen Therapy: Patient Spontanous Breathing  Post-op Assessment: Report given to RN, Post -op Vital signs reviewed and stable, Patient moving all extremities X 4 and Patient able to stick tongue midline  Post vital signs: Reviewed  Last Vitals:  Vitals Value Taken Time  BP 95/53   Temp 97.0   Pulse 58   Resp 15   SpO2 100     Last Pain:  Vitals:   01/03/22 0729  PainSc: 0-No pain         Complications: No notable events documented.

## 2022-01-03 NOTE — Anesthesia Postprocedure Evaluation (Signed)
Anesthesia Post Note  Patient: Caroline May  Procedure(s) Performed: COLONOSCOPY WITH PROPOFOL  Patient location during evaluation: Endoscopy Anesthesia Type: General Level of consciousness: awake and alert and oriented Pain management: pain level controlled Vital Signs Assessment: post-procedure vital signs reviewed and stable Respiratory status: spontaneous breathing, nonlabored ventilation and respiratory function stable Cardiovascular status: blood pressure returned to baseline and stable Postop Assessment: no apparent nausea or vomiting Anesthetic complications: no   No notable events documented.   Last Vitals:  Vitals:   01/03/22 0715 01/03/22 0756  BP: (!) 170/82 (!) 95/53  Pulse: 79 60  Resp: 10 17  Temp:    SpO2: 100% 99%    Last Pain:  Vitals:   01/03/22 0756  TempSrc: Axillary  PainSc: 0-No pain                 David Towson C Kina Shiffman

## 2022-01-03 NOTE — Discharge Instructions (Signed)
You are being discharged to home.  Resume your previous diet.  Your physician has recommended a repeat colonoscopy in five years for screening purposes.  

## 2022-01-03 NOTE — Op Note (Signed)
[facility] Patient Name: Caroline May Procedure Date: 01/03/2022 7:17 AM MRN: [id number] Date of Birth: [birth year] Attending MD: [name] ,  CSN: [id number] Age: 71 Admit Type: Outpatient Procedure:                Colonoscopy Indications:              Screening patient at increased risk: Family history                            of 1st-degree relative with colorectal cancer at                            age 64 years (or older) Providers:                [name], Hughie Closs RN, RN, Tammy                            Vaught, RN, Lurline Del, RN Referring MD:              Medicines:                Monitored Anesthesia Care Complications:            No immediate complications. Estimated Blood Loss:     Estimated blood loss: none. Procedure:                Pre-Anesthesia Assessment:                           - Prior to the procedure, a History and Physical                            was performed, and patient medications, allergies                            and sensitivities were reviewed. The patient's                            tolerance of previous anesthesia was reviewed.                           - The risks and benefits of the procedure and the                            sedation options and risks were discussed with the                            patient. All questions were answered and informed                            consent was obtained.                           - ASA Grade Assessment: II - A patient with mild  systemic disease.                           After obtaining informed consent, the colonoscope                            was passed under direct vision. Throughout the                            procedure, the patient's blood pressure, pulse, and                            oxygen saturations were monitored continuously. The                            [id number]-[id number] ([id number]) scope was introduced through                             the anus and advanced to the the cecum, identified                            by appendiceal orifice and ileocecal valve. The                            colonoscopy was performed without difficulty. The                            patient tolerated the procedure well. The quality                            of the bowel preparation was good. Scope In: 7:30:44 AM Scope Out: 7:51:46 AM Scope Withdrawal Time: 0 hours 14 minutes 18 seconds  Total Procedure Duration: 0 hours 21 minutes 2 seconds  Findings:      The entire examined colon appeared normal.      Non-bleeding internal hemorrhoids were found during retroflexion. The       hemorrhoids were small. Impression:               - The entire examined colon is normal.                           - Non-bleeding internal hemorrhoids.                           - No specimens collected. Moderate Sedation:      Per Anesthesia Care Recommendation:           - Discharge patient to home (ambulatory).                           - Resume previous diet.                           - Repeat colonoscopy in 5 years for screening  purposes. Procedure Code(s):        --- Professional ---                           N1833, Colorectal cancer screening; colonoscopy on                            individual at high risk Diagnosis Code(s):        --- Professional ---                           Z80.0, Family history of malignant neoplasm of                            digestive organs                           K64.8, Other hemorrhoids CPT copyright 2019 American Medical Association. All rights reserved. The codes documented in this report are preliminary and upon coder review may  be revised to meet current compliance requirements. [name], MD [name],  01/03/2022 7:57:28 AM This report has been signed electronically. Number of Addenda: 0

## 2022-01-03 NOTE — Anesthesia Preprocedure Evaluation (Addendum)
Anesthesia Evaluation  Patient identified by MRN, date of birth, ID band Patient awake    Reviewed: Allergy & Precautions, NPO status , Patient's Chart, lab work & pertinent test results  Airway Mallampati: II  TM Distance: <3 FB Neck ROM: Full    Dental  (+) Dental Advisory Given, Teeth Intact   Pulmonary asthma ,    Pulmonary exam normal breath sounds clear to auscultation       Cardiovascular Exercise Tolerance: Good hypertension, Pt. on medications Normal cardiovascular exam Rhythm:Regular Rate:Normal     Neuro/Psych PSYCHIATRIC DISORDERS Anxiety negative neurological ROS     GI/Hepatic Neg liver ROS, GERD  Medicated,  Endo/Other  negative endocrine ROS  Renal/GU negative Renal ROS  negative genitourinary   Musculoskeletal negative musculoskeletal ROS (+)   Abdominal   Peds negative pediatric ROS (+)  Hematology negative hematology ROS (+)   Anesthesia Other Findings   Reproductive/Obstetrics negative OB ROS                            Anesthesia Physical Anesthesia Plan  ASA: 2  Anesthesia Plan: General   Post-op Pain Management: Minimal or no pain anticipated   Induction: Intravenous  PONV Risk Score and Plan: TIVA  Airway Management Planned: Nasal Cannula and Natural Airway  Additional Equipment:   Intra-op Plan:   Post-operative Plan:   Informed Consent: I have reviewed the patients History and Physical, chart, labs and discussed the procedure including the risks, benefits and alternatives for the proposed anesthesia with the patient or authorized representative who has indicated his/her understanding and acceptance.     Dental advisory given  Plan Discussed with: CRNA and Surgeon  Anesthesia Plan Comments:         Anesthesia Quick Evaluation

## 2022-01-03 NOTE — Interval H&P Note (Signed)
History and Physical Interval Note:  01/03/2022 7:22 AM  Caroline May  has presented today for surgery, with the diagnosis of Screening Colonoscopy.  The various methods of treatment have been discussed with the patient and family. After consideration of risks, benefits and other options for treatment, the patient has consented to  Procedure(s) with comments: COLONOSCOPY WITH PROPOFOL (N/A) - 730 ASA 1 as a surgical intervention.  The patient's history has been reviewed, patient examined, no change in status, stable for surgery.  I have reviewed the patient's chart and labs.  Questions were answered to the patient's satisfaction.     [name]

## 2022-01-06 ENCOUNTER — Encounter (INDEPENDENT_AMBULATORY_CARE_PROVIDER_SITE_OTHER): Payer: Self-pay | Admitting: *Deleted

## 2022-01-07 NOTE — Progress Notes (Signed)
Screening

## 2022-01-08 ENCOUNTER — Encounter (HOSPITAL_COMMUNITY): Payer: Self-pay | Admitting: Gastroenterology

## 2022-01-27 ENCOUNTER — Emergency Department (HOSPITAL_COMMUNITY): Payer: Medicare Other

## 2022-01-27 ENCOUNTER — Emergency Department (HOSPITAL_COMMUNITY)
Admission: EM | Admit: 2022-01-27 | Discharge: 2022-01-27 | Disposition: A | Discharge status: Home / Self Care | Payer: Medicare Other | Attending: Emergency Medicine | Admitting: Emergency Medicine

## 2022-01-27 ENCOUNTER — Encounter (HOSPITAL_COMMUNITY): Payer: Self-pay | Admitting: *Deleted

## 2022-01-27 DIAGNOSIS — R079 Chest pain, unspecified: Secondary | ICD-10-CM | POA: Diagnosis present

## 2022-01-27 DIAGNOSIS — R531 Weakness: Secondary | ICD-10-CM | POA: Diagnosis not present

## 2022-01-27 DIAGNOSIS — R0789 Other chest pain: Secondary | ICD-10-CM | POA: Insufficient documentation

## 2022-01-27 DIAGNOSIS — Z79899 Other long term (current) drug therapy: Secondary | ICD-10-CM | POA: Insufficient documentation

## 2022-01-27 DIAGNOSIS — I10 Essential (primary) hypertension: Secondary | ICD-10-CM | POA: Insufficient documentation

## 2022-01-27 DIAGNOSIS — J45909 Unspecified asthma, uncomplicated: Secondary | ICD-10-CM | POA: Diagnosis not present

## 2022-01-27 LAB — CBC
HCT: 41.7 % (ref 36.0–46.0)
Hemoglobin: 13.8 g/dL (ref 12.0–15.0)
MCH: 30.8 pg (ref 26.0–34.0)
MCHC: 33.1 g/dL (ref 30.0–36.0)
MCV: 93.1 fL (ref 80.0–100.0)
Platelets: 297 10*3/uL (ref 150–400)
RBC: 4.48 MIL/uL (ref 3.87–5.11)
RDW: 12.1 % (ref 11.5–15.5)
WBC: 6 10*3/uL (ref 4.0–10.5)
nRBC: 0 % (ref 0.0–0.2)

## 2022-01-27 LAB — TROPONIN I (HIGH SENSITIVITY)
Troponin I (High Sensitivity): 3 ng/L (ref <18)
Troponin I (High Sensitivity): 4 ng/L (ref <18)

## 2022-01-27 LAB — BASIC METABOLIC PANEL
Anion gap: 9 (ref 5–15)
BUN: 12 mg/dL (ref 8–23)
CO2: 26 mmol/L (ref 22–32)
Calcium: 9.5 mg/dL (ref 8.9–10.3)
Chloride: 102 mmol/L (ref 98–111)
Creatinine, Ser: 0.77 mg/dL (ref 0.44–1.00)
GFR, Estimated: >60 mL/min (ref >60)
Glucose, Bld: 102 mg/dL — ABNORMAL HIGH (ref 70–99)
Potassium: 4.2 mmol/L (ref 3.5–5.1)
Sodium: 137 mmol/L (ref 135–145)

## 2022-01-27 MED ORDER — OMEPRAZOLE 40 MG PO CPDR: 40.0000 mg | DELAYED_RELEASE_CAPSULE | Freq: Every day | ORAL | 0 refills | Status: DC

## 2022-01-27 MED ORDER — ALUM & MAG HYDROXIDE-SIMETH 200-200-20 MG/5ML PO SUSP
30.0000 mL | Freq: Once | ORAL | Status: AC
  Administered 2022-01-27: 30 mL via ORAL
  Filled 2022-01-27: qty 30

## 2022-01-27 NOTE — ED Triage Notes (Signed)
Burning in chest for over 4 days, advised to come in for evaluation ?

## 2022-01-27 NOTE — ED Provider Notes (Signed)
?[facility] ?Provider Note ? ? ?CSN: [id number] ?Arrival date & time: 01/27/22  1834 ? ?  ? ?History ? ?Chief Complaint  ?Patient presents with  ? Chest Pain  ? ? ?Caroline May is a 71 y.o. female. ? ?Pt is a 71 yo female with a hx of htn, gerd, asthma, kidney stones, and anxiety presents to the ED today with cp.  Pt said she has had burning in her chest for the past 4 days.  She said she has been taking prilosec and pepcid, which has helped, but it has not gone away.  She called her doctor today to see what else she could take and was told to come to the ED.  Pt also has bilateral arm weakness.  She has had this on and off since November.  Pt said her pcp did a lot of blood work and it was all nl.  He has just decreased her cholesterol medication to see if that would help.  She has a hx of neck problems, but no recent neck scans. ? ? ?  ? ?Home Medications ?Prior to Admission medications   ?Medication Sig Start Date End Date Taking? Authorizing Provider  ?acetaminophen (TYLENOL) 500 MG tablet Take 500-1,000 mg by mouth every 6 (six) hours as needed (pain.).   Yes [provider]  ?atorvastatin (LIPITOR) 20 MG tablet Take 10 mg by mouth every evening.   Yes [provider]  ?carboxymethylcellulose (REFRESH PLUS) 0.5 % SOLN Place 1-2 drops into both eyes 3 (three) times daily as needed (dry/irritated eyes.).   Yes [provider]  ?cetirizine (ZYRTEC) 10 MG tablet Take 10 mg by mouth daily as needed for allergies.   Yes [provider]  ?Cholecalciferol (VITAMIN D) 2000 units tablet Take 2,000 Units by mouth daily with lunch.   Yes [provider]  ?ketoconazole (NIZORAL) 2 % cream Apply 1 application topically daily.   Yes [provider]  ?Astrid Drafts 350 MG CAPS Take 350 mg by mouth daily with lunch.   Yes [provider]  ?LORazepam (ATIVAN) 0.5 MG tablet Take 0.5 mg by mouth daily as needed for anxiety.   Yes [provider]  ?losartan (COZAAR) 100 MG tablet Take 100 mg by mouth every evening.   Yes [provider]  ?Multiple Vitamins-Minerals (HAIR/SKIN/NAILS/BIOTIN PO) Take 1 capsule by mouth daily with lunch.   Yes [provider]  ?Multiple Vitamins-Minerals (MULTIVITAMIN WITH MINERALS) tablet Take 1 tablet by mouth daily with lunch.   Yes [provider]  ?triamcinolone (NASACORT) 55 MCG/ACT AERO nasal inhaler Place 2 sprays into the nose daily as needed (allergies).   Yes [provider]  ?omeprazole (PRILOSEC) 40 MG capsule Take 1 capsule (40 mg total) by mouth daily before breakfast. 01/27/22   Isla Pence, MD  ?   ? ?Allergies    ?Lisinopril, Sulfa antibiotics, and Azithromycin   ? ?Review of Systems   ?Review of Systems  ?Cardiovascular:  Positive for chest pain.  ?All other systems reviewed and are negative. ? ?Physical Exam ?Updated Vital Signs ?BP (!) 138/52   Pulse 62   Temp 98.1 ?F (36.7 ?C)   Resp 17   Ht '5\' 5"'$  (1.651 m)   Wt 63.5 kg   SpO2 98%   BMI 23.30 kg/m?  ?Physical Exam ?Vitals and nursing note reviewed.  ?Constitutional:   ?   Appearance: She is well-developed.  ?HENT:  ?   Head: Normocephalic and atraumatic.  ?  Eyes:  ?   Extraocular Movements: Extraocular movements intact.  ?   Pupils: Pupils are equal, round, and reactive to light.  ?Cardiovascular:  ?   Rate and Rhythm: Normal rate and regular rhythm.  ?   Heart sounds: Normal heart sounds.  ?Pulmonary:  ?   Effort: Pulmonary effort is normal.  ?   Breath sounds: Normal breath sounds.  ?Abdominal:  ?   General: Bowel sounds are normal.  ?   Palpations: Abdomen is soft.  ?Musculoskeletal:     ?   General: Normal range of motion.  ?   Cervical back: Normal range of motion and neck supple.  ?Skin: ?   General: Skin is warm.  ?   Capillary Refill: Capillary refill takes less than 2 seconds.  ?Neurological:  ?   General: No focal deficit present.  ?   Mental Status: She is alert and oriented to person, place, and  time.  ?Psychiatric:     ?   Mood and Affect: Mood normal.     ?   Behavior: Behavior normal.  ? ? ?ED Results / Procedures / Treatments   ?Labs ?(all labs ordered are listed, but only abnormal results are displayed) ?Labs Reviewed  ?BASIC METABOLIC PANEL - Abnormal; Notable for the following components:  ?    Result Value  ? Glucose, Bld 102 (*)   ? All other components within normal limits  ?CBC  ?TROPONIN I (HIGH SENSITIVITY)  ?TROPONIN I (HIGH SENSITIVITY)  ? ? ?EKG ?EKG Interpretation ? ?Date/Time:  Monday January 27 2022 18:48:18 EDT ?Ventricular Rate:  74 ?PR Interval:  157 ?QRS Duration: 89 ?QT Interval:  390 ?QTC Calculation: 433 ?R Axis:   46 ?Text Interpretation: Sinus rhythm No old tracing to compare Confirmed by Isla Pence [phone]) on 01/27/2022 7:25:10 PM ? ?Radiology ?DG Chest 2 View ? ?Result Date: 01/27/2022 ?CLINICAL DATA:  Chest pain EXAM: CHEST - 2 VIEW COMPARISON:  None. FINDINGS: Lungs are clear.  No pleural effusion or pneumothorax. The heart is normal in size. Visualized osseous structures are within normal limits. IMPRESSION: Normal chest radiographs. Electronically Signed   By: Julian Hy M.D.   On: 01/27/2022 19:54   ? ?Procedures ?Procedures  ? ? ?Medications Ordered in ED ?Medications  ?alum & mag hydroxide-simeth (MAALOX/MYLANTA) 200-200-20 MG/5ML suspension 30 mL (30 mLs Oral Given 01/27/22 1944)  ? ? ?ED Course/ Medical Decision Making/ A&P ?  ?                        ?Medical Decision Making ?Amount and/or Complexity of Data Reviewed ?Labs: ordered. ?Radiology: ordered. ? ?Risk ?OTC drugs. ?Prescription drug management. ? ? ?This patient presents to the ED for concern of cp, this involves an extensive number of treatment options, and is a complaint that carries with it a high risk of complications and morbidity.  The differential diagnosis includes cad, pulmonary, gi ? ? ?Co morbidities that complicate the patient evaluation ? ? htn, gerd, asthma, kidney stones, and  anxiety ? ? ?Additional history obtained: ? ?Additional history obtained from epic chart review ?External records from outside source obtained and reviewed including husband ? ? ?Lab Tests: ? ?I Ordered, and personally interpreted labs.  The pertinent results include:  cbc, bmp, trop nl ? ? ?Imaging Studies ordered: ? ?I ordered imaging studies including cxr  ?I independently visualized and interpreted imaging which showed clear ?I agree with the radiologist interpretation ? ? ?Cardiac Monitoring: ? ?  The patient was maintained on a cardiac monitor.  I personally viewed and interpreted the cardiac monitored which showed an underlying rhythm of: nsr ? ? ?Medicines ordered and prescription drug management: ? ?I ordered medication including gi cocktail  for cp  ?Reevaluation of the patient after these medicines showed that the patient improved ?I have reviewed the patients home medicines and have made adjustments as needed ? ? ? ?Problem List / ED Course: ? ?CP:  likely gi.  Heart score of 3.  I will increase her prilosec to 40 mg daily. ?Arm weakness:  I will order an outpatient cervical mri ? ? ?Reevaluation: ? ?After the interventions noted above, I reevaluated the patient and found that they have :improved ? ? ?Social Determinants of Health: ? ?Lives at home with husband ? ? ?Dispostion: ? ?After consideration of the diagnostic results and the patients response to treatment, I feel that the patent would benefit from discharge with outpatient f/u.   ? ? ? ? ? ? ? ?Final Clinical Impression(s) / ED Diagnoses ?Final diagnoses:  ?Atypical chest pain  ? ? ?Rx / DC Orders ?ED Discharge Orders   ? ?      Ordered  ?  MR CERVICAL SPINE WO CONTRAST       ? 01/27/22 2140  ?  omeprazole (PRILOSEC) 40 MG capsule  Daily before breakfast       ? 01/27/22 2140  ? ?  ?  ? ?  ? ? ?  ?Isla Pence, MD ?01/27/22 2159 ? ?

## 2022-01-28 ENCOUNTER — Encounter (INDEPENDENT_AMBULATORY_CARE_PROVIDER_SITE_OTHER): Payer: Self-pay | Admitting: *Deleted

## 2022-02-04 ENCOUNTER — Ambulatory Visit (INDEPENDENT_AMBULATORY_CARE_PROVIDER_SITE_OTHER): Payer: Medicare Other | Admitting: Gastroenterology

## 2022-02-04 ENCOUNTER — Encounter (INDEPENDENT_AMBULATORY_CARE_PROVIDER_SITE_OTHER): Payer: Self-pay | Admitting: Gastroenterology

## 2022-02-04 ENCOUNTER — Other Ambulatory Visit: Payer: Self-pay

## 2022-02-04 VITALS — BP 131/75 | HR 68 | Temp 98.3°F | Ht 65.0 in | Wt 140.5 lb

## 2022-02-04 DIAGNOSIS — K219 Gastro-esophageal reflux disease without esophagitis: Secondary | ICD-10-CM | POA: Diagnosis not present

## 2022-02-04 DIAGNOSIS — R1013 Epigastric pain: Secondary | ICD-10-CM | POA: Diagnosis not present

## 2022-02-04 DIAGNOSIS — R0789 Other chest pain: Secondary | ICD-10-CM | POA: Insufficient documentation

## 2022-02-04 MED ORDER — SUCRALFATE 1 GM/10ML PO SUSP: 1.0000 g | Freq: Four times a day (QID) | ORAL | 1 refills | Status: DC

## 2022-02-04 NOTE — Progress Notes (Signed)
? ?Referring Provider: Pomposini, Cherly Anderson, MD ?Primary Care Physician:  Harmon Pier Cherly Anderson, MD ?Primary GI Physician: castaneda ? ?Chief Complaint  ?Patient presents with  ? Abdominal Pain  ?  Patient here today following up on mid epigastric pain. She reports she visited [facility] last Monday due to the pain. She says the pcp recommended to make sure this was not cardiac related. The Ed increased omeprazole to 40 mg from 20 mg, Pcp also want patient to take pepcid complete with dinner.  ? ?HPI:   ?Caroline May is a 71 y.o. female with past medical history of HTN, urolithiasis, GERD, anxiety, asthma.  ? ?Patient presenting today for epigastric pain/burning in chest. ? ?Last seen 12/24/21 for constipation and LLQ pain, patient started on low FODMAP diet, advised to try probiotic, keep food journal x2 weeks and scheduled for colonoscopy as last scheduled endo eval had to be cancelled due to HTN ? ?Today, patient reports that she had recent visit to ED at [facility] on 01/27/22 for burning chest pain that began last week, was taking omeprazole '20mg'$  and pepcid '20mg'$  in the evening without relief of symptoms. Made her PCP aware and they recommended she proceed to ED for further evaluation to r/o cardiac etiology.  Also endorsed bilateral arm weakness since November. Had MR cervical spine ordered, however this was not completed, omeprazole increased from '20mg'$  to '40mg'$  daily, as cardiac workup was negative. Reports some improvement in pain since PPI was increased. She continues to take her otc pepcid before dinner each night. She does endorse that she takes a blood pressure medication (cozaar) around 9pm and her Lipitor around 11pm. She denies any issues with dysphagia or odynophagia. She denies any nausea or vomiting. She does notice an uncomfortable sensation in epigastric region sometimes after eating with some occasional discomfort to LUQ. She has changed her diet some since having issues with her BP so she has cut out a  lot of salty, greasy, fried foods. She was taking mylanta for symptom relief which helped a lot. Is not taking any NSAIDs. Drinks alcohol very rarely and does report that she cannot drink red wine as it causes severe acid regurgitation. Denies early satiety, bloating, weight loss. Has not been on any new medications recently. Denies much issue with heartburn or acid regurgitation prior to this starting. No rectal bleeding or melena.  ? ?Last Colonoscopy:01/03/22 - The entire examined colon is normal. ?- Non-bleeding internal hemorrhoids. ?- No specimens collected. ?Last Endoscopy: never ? ?Recommendations:  ?Repeat TCS Feb 2028 ? ?Past Medical History:  ?Diagnosis Date  ? Anxiety   ? Asthma   ? GERD (gastroesophageal reflux disease)   ? History of kidney stones   ? Hypertension   ? ? ?Past Surgical History:  ?Procedure Laterality Date  ? APPENDECTOMY    ? COLONOSCOPY N/A 10/08/2016  ? Procedure: COLONOSCOPY;  Surgeon: Rogene Houston, MD;  Location: [facility];  Service: Endoscopy;  Laterality: N/A;  930  ? COLONOSCOPY WITH PROPOFOL N/A 01/03/2022  ? Procedure: COLONOSCOPY WITH PROPOFOL;  Surgeon: Harvel Quale, MD;  Location: [facility];  Service: Gastroenterology;  Laterality: N/A;  730 ASA 1  ? TONSILLECTOMY    ? TUBAL LIGATION  1984  ? ? ?Current Outpatient Medications  ?Medication Sig Dispense Refill  ? acetaminophen (TYLENOL) 500 MG tablet Take 500-1,000 mg by mouth every 6 (six) hours as needed (pain.).    ? atorvastatin (LIPITOR) 20 MG tablet Take 10 mg by  mouth every evening.    ? carboxymethylcellulose (REFRESH PLUS) 0.5 % SOLN Place 1-2 drops into both eyes 3 (three) times daily as needed (dry/irritated eyes.).    ? cetirizine (ZYRTEC) 10 MG tablet Take 10 mg by mouth daily as needed for allergies.    ? Cholecalciferol (VITAMIN D) 2000 units tablet Take 2,000 Units by mouth daily with lunch.    ? famotidine-calcium carbonate-magnesium hydroxide (PEPCID COMPLETE) 10-800-165 MG chewable  tablet Chew 1 tablet by mouth daily as needed. With Dinner    ? ketoconazole (NIZORAL) 2 % cream Apply 1 application topically daily.    ? Krill Oil 350 MG CAPS Take 350 mg by mouth daily with lunch.    ? LORazepam (ATIVAN) 0.5 MG tablet Take 0.5 mg by mouth daily as needed for anxiety.    ? losartan (COZAAR) 100 MG tablet Take 100 mg by mouth every evening.    ? Multiple Vitamins-Minerals (HAIR/SKIN/NAILS/BIOTIN PO) Take 1 capsule by mouth daily with lunch.    ? Multiple Vitamins-Minerals (MULTIVITAMIN WITH MINERALS) tablet Take 1 tablet by mouth daily with lunch.    ? omeprazole (PRILOSEC) 40 MG capsule Take 1 capsule (40 mg total) by mouth daily before breakfast. 30 capsule 0  ? triamcinolone (NASACORT) 55 MCG/ACT AERO nasal inhaler Place 2 sprays into the nose daily as needed (allergies).    ? ?No current facility-administered medications for this visit.  ? ? ?Allergies as of 02/04/2022 - Review Complete 02/04/2022  ?Allergen Reaction Noted  ? Lisinopril Swelling 01/27/2022  ? Sulfa antibiotics Other (See Comments) 10/03/2016  ? Azithromycin Palpitations 10/23/2021  ? ? ?Family History  ?Problem Relation Age of Onset  ? Heart attack Mother   ? Colon cancer Father   ? Heart attack Father   ? Breast cancer Sister   ? Alzheimer's disease Sister   ? Parkinson's disease Brother   ? ? ?Social History  ? ?Socioeconomic History  ? Marital status: Married  ?  Spouse name: Not on file  ? Number of children: Not on file  ? Years of education: Not on file  ? Highest education level: Not on file  ?Occupational History  ? Not on file  ?Tobacco Use  ? Smoking status: Never  ? Smokeless tobacco: Never  ?Vaping Use  ? Vaping Use: Never used  ?Substance and Sexual Activity  ? Alcohol use: Yes  ?  Comment: occasional glass of wine  ? Drug use: No  ? Sexual activity: Not on file  ?Other Topics Concern  ? Not on file  ?Social History Narrative  ? Not on file  ? ?Social Determinants of Health  ? ?Financial Resource Strain: Not on  file  ?Food Insecurity: Not on file  ?Transportation Needs: Not on file  ?Physical Activity: Not on file  ?Stress: Not on file  ?Social Connections: Not on file  ? ?Review of systems ?General: negative for malaise, night sweats, fever, chills, weight loss ?Neck: Negative for lumps, goiter, pain and significant neck swelling ?Resp: Negative for cough, wheezing, dyspnea at rest ?CV: Negative for chest pain, leg swelling, palpitations, orthopnea ?GI: denies melena, hematochezia, nausea, vomiting, diarrhea, constipation, dysphagia, odyonophagia, early satiety or unintentional weight loss. +epigastric pain/burning +LUQ discomfort ?MSK: Negative for joint pain or swelling, back pain, and muscle pain. ?Derm: Negative for itching or rash ?Psych: Denies depression, anxiety, memory loss, confusion. No homicidal or suicidal ideation.  ?Heme: Negative for prolonged bleeding, bruising easily, and swollen nodes. ?Endocrine: Negative for cold or heat intolerance, polyuria,  polydipsia and goiter. ?Neuro: negative for tremor, gait imbalance, syncope and seizures. ?The remainder of the review of systems is noncontributory. ? ?Physical Exam: ?BP 131/75 (BP Location: Left Arm, Patient Position: Sitting, Cuff Size: Small)   Pulse 68   Temp 98.3 ?F (36.8 ?C) (Oral)   Ht '5\' 5"'$  (1.651 m)   Wt 140 lb 8 oz (63.7 kg)   BMI 23.38 kg/m?  ?General:   Alert and oriented. No distress noted. Pleasant and cooperative.  ?Head:  Normocephalic and atraumatic. ?Eyes:  Conjuctiva clear without scleral icterus. ?Mouth:  Oral mucosa pink and moist. Good dentition. No lesions. ?Heart: Normal rate and rhythm, s1 and s2 heart sounds present.  ?Lungs: Clear lung sounds in all lobes. Respirations equal and unlabored. ?Abdomen:  +BS, soft, non-tender and non-distended. No rebound or guarding. No HSM or masses noted. ?Derm: No palmar erythema or jaundice ?Msk:  Symmetrical without gross deformities. Normal posture. ?Extremities:  Without edema. ?Neurologic:   Alert and  oriented x4 ?Psych:  Alert and cooperative. Normal mood and affect. ? ?Invalid input(s): 6 MONTHS  ? ?ASSESSMENT: ?Caroline May is a 71 y.o. female presenting today for epigastric pain/burning that beg

## 2022-02-04 NOTE — Patient Instructions (Signed)
Please continue omeprazole '40mg'$  once daily, make sure you are avoiding trigger foods such as greasy, spicy, fried, citrus foods, and be mindful that caffeine, carbonated drinks, chocolate and alcohol can increase reflux symptoms ?Stay upright 2-3 hours after eating, prior to lying down and avoid eating late in the evenings. ?Please avoid taking medications and immediately laying down ?I have also sent carafate for you to take 30 minutes prior to meals and at bedtime, this is really more for symptom relief. ? ?We will get you scheduled for EGD for further evaluation of your symptoms, as discussed I suspect you could have some inflammation/irritation from reflux/medications you are taking ? ?Follow up 4 months ? ?

## 2022-02-04 NOTE — H&P (View-Only) (Signed)
? ?Referring Provider: Pomposini, Cherly Anderson, MD ?Primary Care Physician:  Harmon Pier Cherly Anderson, MD ?Primary GI Physician: castaneda ? ?Chief Complaint  ?Patient presents with  ? Abdominal Pain  ?  Patient here today following up on mid epigastric pain. She reports she visited [facility] last Monday due to the pain. She says the pcp recommended to make sure this was not cardiac related. The Ed increased omeprazole to 40 mg from 20 mg, Pcp also want patient to take pepcid complete with dinner.  ? ?HPI:   ?Caroline May is a 71 y.o. female with past medical history of HTN, urolithiasis, GERD, anxiety, asthma.  ? ?Patient presenting today for epigastric pain/burning in chest. ? ?Last seen 12/24/21 for constipation and LLQ pain, patient started on low FODMAP diet, advised to try probiotic, keep food journal x2 weeks and scheduled for colonoscopy as last scheduled endo eval had to be cancelled due to HTN ? ?Today, patient reports that she had recent visit to ED at [facility] on 01/27/22 for burning chest pain that began last week, was taking omeprazole '20mg'$  and pepcid '20mg'$  in the evening without relief of symptoms. Made her PCP aware and they recommended she proceed to ED for further evaluation to r/o cardiac etiology.  Also endorsed bilateral arm weakness since November. Had MR cervical spine ordered, however this was not completed, omeprazole increased from '20mg'$  to '40mg'$  daily, as cardiac workup was negative. Reports some improvement in pain since PPI was increased. She continues to take her otc pepcid before dinner each night. She does endorse that she takes a blood pressure medication (cozaar) around 9pm and her Lipitor around 11pm. She denies any issues with dysphagia or odynophagia. She denies any nausea or vomiting. She does notice an uncomfortable sensation in epigastric region sometimes after eating with some occasional discomfort to LUQ. She has changed her diet some since having issues with her BP so she has cut out a  lot of salty, greasy, fried foods. She was taking mylanta for symptom relief which helped a lot. Is not taking any NSAIDs. Drinks alcohol very rarely and does report that she cannot drink red wine as it causes severe acid regurgitation. Denies early satiety, bloating, weight loss. Has not been on any new medications recently. Denies much issue with heartburn or acid regurgitation prior to this starting. No rectal bleeding or melena.  ? ?Last Colonoscopy:01/03/22 - The entire examined colon is normal. ?- Non-bleeding internal hemorrhoids. ?- No specimens collected. ?Last Endoscopy: never ? ?Recommendations:  ?Repeat TCS Feb 2028 ? ?Past Medical History:  ?Diagnosis Date  ? Anxiety   ? Asthma   ? GERD (gastroesophageal reflux disease)   ? History of kidney stones   ? Hypertension   ? ? ?Past Surgical History:  ?Procedure Laterality Date  ? APPENDECTOMY    ? COLONOSCOPY N/A 10/08/2016  ? Procedure: COLONOSCOPY;  Surgeon: Rogene Houston, MD;  Location: [facility];  Service: Endoscopy;  Laterality: N/A;  930  ? COLONOSCOPY WITH PROPOFOL N/A 01/03/2022  ? Procedure: COLONOSCOPY WITH PROPOFOL;  Surgeon: Harvel Quale, MD;  Location: [facility];  Service: Gastroenterology;  Laterality: N/A;  730 ASA 1  ? TONSILLECTOMY    ? TUBAL LIGATION  1984  ? ? ?Current Outpatient Medications  ?Medication Sig Dispense Refill  ? acetaminophen (TYLENOL) 500 MG tablet Take 500-1,000 mg by mouth every 6 (six) hours as needed (pain.).    ? atorvastatin (LIPITOR) 20 MG tablet Take 10 mg by  mouth every evening.    ? carboxymethylcellulose (REFRESH PLUS) 0.5 % SOLN Place 1-2 drops into both eyes 3 (three) times daily as needed (dry/irritated eyes.).    ? cetirizine (ZYRTEC) 10 MG tablet Take 10 mg by mouth daily as needed for allergies.    ? Cholecalciferol (VITAMIN D) 2000 units tablet Take 2,000 Units by mouth daily with lunch.    ? famotidine-calcium carbonate-magnesium hydroxide (PEPCID COMPLETE) 10-800-165 MG chewable  tablet Chew 1 tablet by mouth daily as needed. With Dinner    ? ketoconazole (NIZORAL) 2 % cream Apply 1 application topically daily.    ? Krill Oil 350 MG CAPS Take 350 mg by mouth daily with lunch.    ? LORazepam (ATIVAN) 0.5 MG tablet Take 0.5 mg by mouth daily as needed for anxiety.    ? losartan (COZAAR) 100 MG tablet Take 100 mg by mouth every evening.    ? Multiple Vitamins-Minerals (HAIR/SKIN/NAILS/BIOTIN PO) Take 1 capsule by mouth daily with lunch.    ? Multiple Vitamins-Minerals (MULTIVITAMIN WITH MINERALS) tablet Take 1 tablet by mouth daily with lunch.    ? omeprazole (PRILOSEC) 40 MG capsule Take 1 capsule (40 mg total) by mouth daily before breakfast. 30 capsule 0  ? triamcinolone (NASACORT) 55 MCG/ACT AERO nasal inhaler Place 2 sprays into the nose daily as needed (allergies).    ? ?No current facility-administered medications for this visit.  ? ? ?Allergies as of 02/04/2022 - Review Complete 02/04/2022  ?Allergen Reaction Noted  ? Lisinopril Swelling 01/27/2022  ? Sulfa antibiotics Other (See Comments) 10/03/2016  ? Azithromycin Palpitations 10/23/2021  ? ? ?Family History  ?Problem Relation Age of Onset  ? Heart attack Mother   ? Colon cancer Father   ? Heart attack Father   ? Breast cancer Sister   ? Alzheimer's disease Sister   ? Parkinson's disease Brother   ? ? ?Social History  ? ?Socioeconomic History  ? Marital status: Married  ?  Spouse name: Not on file  ? Number of children: Not on file  ? Years of education: Not on file  ? Highest education level: Not on file  ?Occupational History  ? Not on file  ?Tobacco Use  ? Smoking status: Never  ? Smokeless tobacco: Never  ?Vaping Use  ? Vaping Use: Never used  ?Substance and Sexual Activity  ? Alcohol use: Yes  ?  Comment: occasional glass of wine  ? Drug use: No  ? Sexual activity: Not on file  ?Other Topics Concern  ? Not on file  ?Social History Narrative  ? Not on file  ? ?Social Determinants of Health  ? ?Financial Resource Strain: Not on  file  ?Food Insecurity: Not on file  ?Transportation Needs: Not on file  ?Physical Activity: Not on file  ?Stress: Not on file  ?Social Connections: Not on file  ? ?Review of systems ?General: negative for malaise, night sweats, fever, chills, weight loss ?Neck: Negative for lumps, goiter, pain and significant neck swelling ?Resp: Negative for cough, wheezing, dyspnea at rest ?CV: Negative for chest pain, leg swelling, palpitations, orthopnea ?GI: denies melena, hematochezia, nausea, vomiting, diarrhea, constipation, dysphagia, odyonophagia, early satiety or unintentional weight loss. +epigastric pain/burning +LUQ discomfort ?MSK: Negative for joint pain or swelling, back pain, and muscle pain. ?Derm: Negative for itching or rash ?Psych: Denies depression, anxiety, memory loss, confusion. No homicidal or suicidal ideation.  ?Heme: Negative for prolonged bleeding, bruising easily, and swollen nodes. ?Endocrine: Negative for cold or heat intolerance, polyuria,  polydipsia and goiter. ?Neuro: negative for tremor, gait imbalance, syncope and seizures. ?The remainder of the review of systems is noncontributory. ? ?Physical Exam: ?BP 131/75 (BP Location: Left Arm, Patient Position: Sitting, Cuff Size: Small)   Pulse 68   Temp 98.3 ?F (36.8 ?C) (Oral)   Ht '5\' 5"'$  (1.651 m)   Wt 140 lb 8 oz (63.7 kg)   BMI 23.38 kg/m?  ?General:   Alert and oriented. No distress noted. Pleasant and cooperative.  ?Head:  Normocephalic and atraumatic. ?Eyes:  Conjuctiva clear without scleral icterus. ?Mouth:  Oral mucosa pink and moist. Good dentition. No lesions. ?Heart: Normal rate and rhythm, s1 and s2 heart sounds present.  ?Lungs: Clear lung sounds in all lobes. Respirations equal and unlabored. ?Abdomen:  +BS, soft, non-tender and non-distended. No rebound or guarding. No HSM or masses noted. ?Derm: No palmar erythema or jaundice ?Msk:  Symmetrical without gross deformities. Normal posture. ?Extremities:  Without edema. ?Neurologic:   Alert and  oriented x4 ?Psych:  Alert and cooperative. Normal mood and affect. ? ?Invalid input(s): 6 MONTHS  ? ?ASSESSMENT: ?Caroline May is a 71 y.o. female presenting today for epigastric pain/burning that beg

## 2022-02-05 ENCOUNTER — Encounter (INDEPENDENT_AMBULATORY_CARE_PROVIDER_SITE_OTHER): Payer: Self-pay

## 2022-02-05 ENCOUNTER — Other Ambulatory Visit (INDEPENDENT_AMBULATORY_CARE_PROVIDER_SITE_OTHER): Payer: Self-pay

## 2022-02-05 DIAGNOSIS — R1013 Epigastric pain: Secondary | ICD-10-CM

## 2022-02-20 ENCOUNTER — Ambulatory Visit (INDEPENDENT_AMBULATORY_CARE_PROVIDER_SITE_OTHER): Payer: Medicare Other | Admitting: Gastroenterology

## 2022-02-21 ENCOUNTER — Other Ambulatory Visit: Payer: Self-pay

## 2022-02-21 ENCOUNTER — Ambulatory Visit (HOSPITAL_COMMUNITY)
Admission: RE | Admit: 2022-02-21 | Discharge: 2022-02-21 | Disposition: A | Discharge status: Home / Self Care | Payer: Medicare Other | Attending: Gastroenterology | Admitting: Gastroenterology

## 2022-02-21 ENCOUNTER — Encounter (HOSPITAL_COMMUNITY)
Admission: RE | Disposition: A | Discharge status: Home / Self Care | Payer: Self-pay | Source: Home / Self Care | Attending: Gastroenterology

## 2022-02-21 ENCOUNTER — Ambulatory Visit (HOSPITAL_BASED_OUTPATIENT_CLINIC_OR_DEPARTMENT_OTHER): Payer: Medicare Other | Admitting: Anesthesiology

## 2022-02-21 ENCOUNTER — Encounter (HOSPITAL_COMMUNITY): Payer: Self-pay | Admitting: Gastroenterology

## 2022-02-21 ENCOUNTER — Ambulatory Visit (HOSPITAL_COMMUNITY): Payer: Medicare Other | Admitting: Anesthesiology

## 2022-02-21 DIAGNOSIS — Z79899 Other long term (current) drug therapy: Secondary | ICD-10-CM | POA: Diagnosis not present

## 2022-02-21 DIAGNOSIS — K3189 Other diseases of stomach and duodenum: Secondary | ICD-10-CM

## 2022-02-21 DIAGNOSIS — J45909 Unspecified asthma, uncomplicated: Secondary | ICD-10-CM | POA: Insufficient documentation

## 2022-02-21 DIAGNOSIS — Z87442 Personal history of urinary calculi: Secondary | ICD-10-CM | POA: Insufficient documentation

## 2022-02-21 DIAGNOSIS — I10 Essential (primary) hypertension: Secondary | ICD-10-CM | POA: Diagnosis not present

## 2022-02-21 DIAGNOSIS — R1013 Epigastric pain: Secondary | ICD-10-CM | POA: Insufficient documentation

## 2022-02-21 DIAGNOSIS — F419 Anxiety disorder, unspecified: Secondary | ICD-10-CM | POA: Diagnosis not present

## 2022-02-21 DIAGNOSIS — K449 Diaphragmatic hernia without obstruction or gangrene: Secondary | ICD-10-CM | POA: Diagnosis not present

## 2022-02-21 DIAGNOSIS — K219 Gastro-esophageal reflux disease without esophagitis: Secondary | ICD-10-CM | POA: Diagnosis not present

## 2022-02-21 DIAGNOSIS — R0789 Other chest pain: Secondary | ICD-10-CM

## 2022-02-21 DIAGNOSIS — D132 Benign neoplasm of duodenum: Secondary | ICD-10-CM | POA: Diagnosis not present

## 2022-02-21 HISTORY — PX: ESOPHAGOGASTRODUODENOSCOPY (EGD) WITH PROPOFOL: SHX5813

## 2022-02-21 HISTORY — PX: BIOPSY: SHX5522

## 2022-02-21 SURGERY — ESOPHAGOGASTRODUODENOSCOPY (EGD) WITH PROPOFOL
Anesthesia: General

## 2022-02-21 MED ORDER — PROPOFOL 10 MG/ML IV BOLUS
INTRAVENOUS | Status: DC | PRN
  Administered 2022-02-21: 30 mg via INTRAVENOUS
  Administered 2022-02-21: 80 mg via INTRAVENOUS
  Administered 2022-02-21: 30 mg via INTRAVENOUS
  Administered 2022-02-21: 20 mg via INTRAVENOUS
  Administered 2022-02-21: 30 mg via INTRAVENOUS

## 2022-02-21 MED ORDER — LACTATED RINGERS IV SOLN: INTRAVENOUS | Status: DC

## 2022-02-21 MED ORDER — PROPOFOL 500 MG/50ML IV EMUL
INTRAVENOUS | Status: DC | PRN
  Administered 2022-02-21: 150 ug/kg/min via INTRAVENOUS

## 2022-02-21 MED ORDER — LIDOCAINE HCL (CARDIAC) PF 100 MG/5ML IV SOSY
PREFILLED_SYRINGE | INTRAVENOUS | Status: DC | PRN
  Administered 2022-02-21: 40 mg via INTRAVENOUS

## 2022-02-21 NOTE — Transfer of Care (Signed)
Immediate Anesthesia Transfer of Care Note ? ?Patient: Caroline May ? ?Procedure(s) Performed: ESOPHAGOGASTRODUODENOSCOPY (EGD) WITH PROPOFOL ?BIOPSY ? ?Patient Location: Endoscopy Unit ? ?Anesthesia Type:General ? ?Level of Consciousness: drowsy ? ?Airway & Oxygen Therapy: Patient Spontanous Breathing ? ?Post-op Assessment: Report given to RN and Post -op Vital signs reviewed and stable ? ?Post vital signs: Reviewed and stable ? ?Last Vitals:  ?Vitals Value Taken Time  ?BP    ?Temp    ?Pulse 59   ?Resp 19   ?SpO2 96%   ? ? ?Last Pain:  ?Vitals:  ? 02/21/22 1026  ?TempSrc:   ?PainSc: 0-No pain  ?   ? ?Patients Stated Pain Goal: 4 (02/21/22 1610) ? ?Complications: No notable events documented. ?

## 2022-02-21 NOTE — Interval H&P Note (Signed)
History and Physical Interval Note: ? ?02/21/2022 ?9:21 AM ? ?Caroline May  has presented today for surgery, with the diagnosis of Epigastric Pain.  The various methods of treatment have been discussed with the patient and family. After consideration of risks, benefits and other options for treatment, the patient has consented to  Procedure(s) with comments: ?ESOPHAGOGASTRODUODENOSCOPY (EGD) WITH PROPOFOL (N/A) - 220, per Darius Bump pt moved up and knows new time as a surgical intervention.  The patient's history has been reviewed, patient examined, no change in status, stable for surgery.  I have reviewed the patient's chart and labs.  Questions were answered to the patient's satisfaction.   ? ? ?[name] ? ? ?

## 2022-02-21 NOTE — Anesthesia Postprocedure Evaluation (Signed)
Anesthesia Post Note ? ?Patient: Caroline May ? ?Procedure(s) Performed: ESOPHAGOGASTRODUODENOSCOPY (EGD) WITH PROPOFOL ?BIOPSY ? ?Patient location during evaluation: Endoscopy ?Anesthesia Type: General ?Level of consciousness: awake and alert and oriented ?Pain management: pain level controlled ?Vital Signs Assessment: post-procedure vital signs reviewed and stable ?Respiratory status: spontaneous breathing, nonlabored ventilation and respiratory function stable ?Cardiovascular status: blood pressure returned to baseline and stable ?Postop Assessment: no apparent nausea or vomiting ?Anesthetic complications: no ? ? ?No notable events documented. ? ? ?Last Vitals:  ?Vitals:  ? 02/21/22 1045 02/21/22 1049  ?BP: (!) 93/42 (!) 108/48  ?Pulse: 60 72  ?Resp: 17 20  ?Temp: 36.6 ?C   ?SpO2: 96% 98%  ?  ?Last Pain:  ?Vitals:  ? 02/21/22 1049  ?TempSrc:   ?PainSc: 0-No pain  ? ? ?  ?  ?  ?  ?  ?  ? ?Akisha Sturgill C Jas Betten ? ? ? ? ?

## 2022-02-21 NOTE — Discharge Instructions (Signed)
You are being discharged to home.  ?Advance your diet as tolerated.  ?We are waiting for your pathology results.  ?Continue omeprazole 40 mg qday. ?Can take sucralfate until you complete a month of treatment. ?

## 2022-02-21 NOTE — Op Note (Signed)
[facility] ?Patient Name: Caroline May ?Procedure Date: 02/21/2022 10:07 AM ?MRN: [id number] ?Date of Birth: 19-Aug-1951 ?Attending MD: [name] ,  ?CSN: [id number] ?Age: 71 ?Admit Type: Outpatient ?Procedure:                Upper GI endoscopy ?Indications:              Epigastric abdominal pain, Follow-up of  ?                          gastro-esophageal reflux disease, Chest pain (non  ?                          cardiac) ?Providers:                [name], Charlsie Quest Theda Sers RN, RN, Kenney Houseman  ?                          Wilson ?Referring MD:              ?Medicines:                Monitored Anesthesia Care ?Complications:            No immediate complications. ?Estimated Blood Loss:     Estimated blood loss: none. ?Procedure:                Pre-Anesthesia Assessment: ?                          - Prior to the procedure, a History and Physical  ?                          was performed, and patient medications, allergies  ?                          and sensitivities were reviewed. The patient's  ?                          tolerance of previous anesthesia was reviewed. ?                          - The risks and benefits of the procedure and the  ?                          sedation options and risks were discussed with the  ?                          patient. All questions were answered and informed  ?                          consent was obtained. ?                          - ASA Grade Assessment: II - A patient with mild  ?                          systemic disease. ?  After obtaining informed consent, the endoscope was  ?                          passed under direct vision. Throughout the  ?                          procedure, the patient's blood pressure, pulse, and  ?                          oxygen saturations were monitored continuously. The  ?                          GIF-H190 ([id number]) scope was introduced through the  ?                          mouth, and advanced to the  second part of duodenum.  ?                          The upper GI endoscopy was accomplished without  ?                          difficulty. The patient tolerated the procedure  ?                          well. ?Scope In: 10:30:56 AM ?Scope Out: 10:42:43 AM ?Total Procedure Duration: 0 hours 11 minutes 47 seconds  ?Findings: ?     A 1 cm hiatal hernia was present. ?     The exam of the esophagus was otherwise normal. ?     The entire examined stomach was normal. ?     A single 2 mm mucosal nodule was found in the second portion of the  ?     duodenum, just distal to the ampulla (minor papilla vs adenoma?).  ?     Imaging was performed using white light and narrow band imaging to  ?     visualize the mucosa. Biopsies were taken with a cold forceps for  ?     histology. ?Impression:               - 1 cm hiatal hernia. ?                          - Normal stomach. ?                          - Mucosal nodule found in the duodenum. Biopsied. ?Moderate Sedation: ?     Per Anesthesia Care ?Recommendation:           - Discharge patient to home (ambulatory). ?                          - Advance diet as tolerated. ?                          - Continue omeprazole 40 mg qday. ?                          -  Can take sucralfate until you complete a month of  ?                          treatment. ?                          - Await pathology results. ?Procedure Code(s):        --- Professional --- ?                          [phone], Esophagogastroduodenoscopy, flexible,  ?                          transoral; with biopsy, single or multiple ?Diagnosis Code(s):        --- Professional --- ?                          K44.9, Diaphragmatic hernia without obstruction or  ?                          gangrene ?                          K31.89, Other diseases of stomach and duodenum ?                          R10.13, Epigastric pain ?                          K21.9, Gastro-esophageal reflux disease without  ?                          esophagitis ?                           R07.89, Other chest pain ?CPT copyright 2019 American Medical Association. All rights reserved. ?The codes documented in this report are preliminary and upon coder review may  ?be revised to meet current compliance requirements. ?[name], MD ?[name],  ?02/21/2022 10:51:53 AM ?This report has been signed electronically. ?Number of Addenda: 0 ?

## 2022-02-21 NOTE — Anesthesia Preprocedure Evaluation (Addendum)
Anesthesia Evaluation  ?Patient identified by MRN, date of birth, ID band ?Patient awake ? ? ? ?Reviewed: ?Allergy & Precautions, NPO status  ? ?Airway ?Mallampati: II ? ?TM Distance: >3 FB ?Neck ROM: Full ? ? ? Dental ? ?(+) Teeth Intact ?  ?Pulmonary ?asthma ,  ?  ?Pulmonary exam normal ?breath sounds clear to auscultation ? ? ? ? ? ? Cardiovascular ?hypertension, Pt. on medications ?Normal cardiovascular exam ?Rhythm:Regular Rate:Normal ? ? ?  ?Neuro/Psych ?negative neurological ROS ?   ? GI/Hepatic ?Neg liver ROS, GERD  Medicated and Controlled,  ?Endo/Other  ?negative endocrine ROS ? Renal/GU ?negative Renal ROS  ?negative genitourinary ?  ?Musculoskeletal ?negative musculoskeletal ROS ?(+)  ? Abdominal ?  ?Peds ? Hematology ?negative hematology ROS ?(+)   ?Anesthesia Other Findings ? ? Reproductive/Obstetrics ?negative OB ROS ? ?  ? ? ? ? ? ? ? ? ? ? ? ? ? ?  ?  ? ? ? ? ? ? ?Anesthesia Physical ?Anesthesia Plan ? ?ASA: 2 ? ?Anesthesia Plan: General  ? ?Post-op Pain Management: Minimal or no pain anticipated  ? ?Induction: Intravenous ? ?PONV Risk Score and Plan: Propofol infusion ? ?Airway Management Planned: Nasal Cannula and Natural Airway ? ?Additional Equipment:  ? ?Intra-op Plan:  ? ?Post-operative Plan:  ? ?Informed Consent: I have reviewed the patients History and Physical, chart, labs and discussed the procedure including the risks, benefits and alternatives for the proposed anesthesia with the patient or authorized representative who has indicated his/her understanding and acceptance.  ? ? ? ?Dental advisory given ? ?Plan Discussed with:  ? ?Anesthesia Plan Comments:   ? ? ? ? ? ?Anesthesia Quick Evaluation ? ?

## 2022-02-24 LAB — SURGICAL PATHOLOGY

## 2022-02-25 ENCOUNTER — Other Ambulatory Visit (INDEPENDENT_AMBULATORY_CARE_PROVIDER_SITE_OTHER): Payer: Self-pay

## 2022-02-25 ENCOUNTER — Encounter (HOSPITAL_COMMUNITY): Payer: Self-pay | Admitting: Gastroenterology

## 2022-02-25 ENCOUNTER — Encounter (INDEPENDENT_AMBULATORY_CARE_PROVIDER_SITE_OTHER): Payer: Self-pay

## 2022-02-25 DIAGNOSIS — D132 Benign neoplasm of duodenum: Secondary | ICD-10-CM

## 2022-04-09 ENCOUNTER — Telehealth (INDEPENDENT_AMBULATORY_CARE_PROVIDER_SITE_OTHER): Payer: Self-pay

## 2022-04-09 ENCOUNTER — Ambulatory Visit (HOSPITAL_COMMUNITY)
Admission: RE | Admit: 2022-04-09 | Discharge: 2022-04-09 | Disposition: A | Discharge status: Home / Self Care | Payer: Medicare Other | Attending: Gastroenterology | Admitting: Gastroenterology

## 2022-04-09 ENCOUNTER — Ambulatory Visit (HOSPITAL_BASED_OUTPATIENT_CLINIC_OR_DEPARTMENT_OTHER): Payer: Medicare Other | Admitting: Anesthesiology

## 2022-04-09 ENCOUNTER — Ambulatory Visit (HOSPITAL_COMMUNITY): Payer: Medicare Other | Admitting: Anesthesiology

## 2022-04-09 ENCOUNTER — Encounter (HOSPITAL_COMMUNITY)
Admission: RE | Disposition: A | Discharge status: Home / Self Care | Payer: Self-pay | Source: Home / Self Care | Attending: Gastroenterology

## 2022-04-09 ENCOUNTER — Other Ambulatory Visit: Payer: Self-pay

## 2022-04-09 ENCOUNTER — Other Ambulatory Visit (INDEPENDENT_AMBULATORY_CARE_PROVIDER_SITE_OTHER): Payer: Self-pay

## 2022-04-09 ENCOUNTER — Encounter (HOSPITAL_COMMUNITY): Payer: Self-pay | Admitting: Gastroenterology

## 2022-04-09 DIAGNOSIS — K317 Polyp of stomach and duodenum: Secondary | ICD-10-CM

## 2022-04-09 DIAGNOSIS — K3189 Other diseases of stomach and duodenum: Secondary | ICD-10-CM | POA: Diagnosis not present

## 2022-04-09 DIAGNOSIS — K449 Diaphragmatic hernia without obstruction or gangrene: Secondary | ICD-10-CM | POA: Diagnosis not present

## 2022-04-09 DIAGNOSIS — I10 Essential (primary) hypertension: Secondary | ICD-10-CM | POA: Insufficient documentation

## 2022-04-09 DIAGNOSIS — Z800 Family history of malignant neoplasm of digestive organs: Secondary | ICD-10-CM | POA: Diagnosis not present

## 2022-04-09 DIAGNOSIS — K219 Gastro-esophageal reflux disease without esophagitis: Secondary | ICD-10-CM | POA: Insufficient documentation

## 2022-04-09 DIAGNOSIS — R1013 Epigastric pain: Secondary | ICD-10-CM

## 2022-04-09 DIAGNOSIS — Z8249 Family history of ischemic heart disease and other diseases of the circulatory system: Secondary | ICD-10-CM | POA: Diagnosis not present

## 2022-04-09 DIAGNOSIS — Z87442 Personal history of urinary calculi: Secondary | ICD-10-CM | POA: Diagnosis not present

## 2022-04-09 DIAGNOSIS — D132 Benign neoplasm of duodenum: Secondary | ICD-10-CM | POA: Diagnosis not present

## 2022-04-09 DIAGNOSIS — Z79899 Other long term (current) drug therapy: Secondary | ICD-10-CM | POA: Diagnosis not present

## 2022-04-09 DIAGNOSIS — J45909 Unspecified asthma, uncomplicated: Secondary | ICD-10-CM | POA: Insufficient documentation

## 2022-04-09 DIAGNOSIS — F419 Anxiety disorder, unspecified: Secondary | ICD-10-CM | POA: Insufficient documentation

## 2022-04-09 DIAGNOSIS — K319 Disease of stomach and duodenum, unspecified: Secondary | ICD-10-CM | POA: Diagnosis not present

## 2022-04-09 DIAGNOSIS — R1084 Generalized abdominal pain: Secondary | ICD-10-CM

## 2022-04-09 HISTORY — PX: POLYPECTOMY: SHX5525

## 2022-04-09 HISTORY — PX: ESOPHAGOGASTRODUODENOSCOPY (EGD) WITH PROPOFOL: SHX5813

## 2022-04-09 HISTORY — PX: BIOPSY: SHX5522

## 2022-04-09 SURGERY — ESOPHAGOGASTRODUODENOSCOPY (EGD) WITH PROPOFOL
Anesthesia: General

## 2022-04-09 MED ORDER — LIDOCAINE 2% (20 MG/ML) 5 ML SYRINGE
INTRAMUSCULAR | Status: DC | PRN
  Administered 2022-04-09: 50 mg via INTRAVENOUS

## 2022-04-09 MED ORDER — LACTATED RINGERS IV SOLN: INTRAVENOUS | Status: DC

## 2022-04-09 MED ORDER — PROPOFOL 10 MG/ML IV BOLUS
INTRAVENOUS | Status: DC | PRN
  Administered 2022-04-09: 40 mg via INTRAVENOUS
  Administered 2022-04-09: 20 mg via INTRAVENOUS
  Administered 2022-04-09: 60 mg via INTRAVENOUS
  Administered 2022-04-09: 40 mg via INTRAVENOUS
  Administered 2022-04-09: 20 mg via INTRAVENOUS
  Administered 2022-04-09: 40 mg via INTRAVENOUS
  Administered 2022-04-09: 80 mg via INTRAVENOUS

## 2022-04-09 NOTE — Anesthesia Postprocedure Evaluation (Signed)
Anesthesia Post Note  Patient: Caroline May  Procedure(s) Performed: ESOPHAGOGASTRODUODENOSCOPY (EGD) WITH PROPOFOL POLYPECTOMY BIOPSY  Patient location during evaluation: Phase II Anesthesia Type: General Level of consciousness: awake Pain management: pain level controlled Vital Signs Assessment: post-procedure vital signs reviewed and stable Respiratory status: spontaneous breathing and respiratory function stable Cardiovascular status: blood pressure returned to baseline and stable Postop Assessment: no headache and no apparent nausea or vomiting Anesthetic complications: no Comments: Late entry   No notable events documented.   Last Vitals:  Vitals:   04/09/22 0836 04/09/22 1038  BP: (!) 163/76 (!) 98/46  Resp: 18 16  Temp: 36.5 C (!) 36.4 C  SpO2: 98% 97%    Last Pain:  Vitals:   04/09/22 1038  TempSrc: Oral  PainSc: 0-No pain                 Louann Sjogren

## 2022-04-09 NOTE — Telephone Encounter (Signed)
Ct scan is scheduled at [facility] on Tuesday 04/15/22 at 9:30 am and she is aware

## 2022-04-09 NOTE — Discharge Instructions (Addendum)
You are being discharged to home.  Resume your previous diet.  We are waiting for your pathology results.  Continue daily omeprazole for at least 2 more weeks. Schedule CT angio abdomen and pelvis. Caroline May from the office will call you with this information.

## 2022-04-09 NOTE — Op Note (Signed)
[facility] Patient Name: Caroline May Procedure Date: 04/09/2022 10:06 AM MRN: [id number] Date of Birth: 01-28-1951 Attending MD: [name] ,  CSN: [id number] Age: 71 Admit Type: Outpatient Procedure:                Upper GI endoscopy Indications:              Epigastric abdominal pain, For therapy of polyps in                            the duodenum Providers:                [name], Lambert Mody, Bonnetta Barry,                            Technician Referring MD:              Medicines:                Monitored Anesthesia Care Complications:            No immediate complications. Estimated Blood Loss:     Estimated blood loss: none. Procedure:                Pre-Anesthesia Assessment:                           - Prior to the procedure, a History and Physical                            was performed, and patient medications, allergies                            and sensitivities were reviewed. The patient's                            tolerance of previous anesthesia was reviewed.                           - The risks and benefits of the procedure and the                            sedation options and risks were discussed with the                            patient. All questions were answered and informed                            consent was obtained.                           - ASA Grade Assessment: II - A patient with mild                            systemic disease.                           After obtaining informed consent, the endoscope was  passed under direct vision. Throughout the                            procedure, the patient's blood pressure, pulse, and                            oxygen saturations were monitored continuously. The                            GIF-H190 ([id number]) scope was introduced through the                            mouth, and advanced to the second part of duodenum.                            The  upper GI endoscopy was accomplished without                            difficulty. The patient tolerated the procedure                            well. Scope In: 10:17:59 AM Scope Out: 10:33:52 AM Total Procedure Duration: 0 hours 15 minutes 53 seconds  Findings:      A 2 cm hiatal hernia was present.      The entire examined stomach was normal. Biopsies were taken with a cold       forceps for Helicobacter pylori testing.      A small sessile mass, consistent with adenoma, with no bleeding was       found in the second portion of the duodenum. The polyp was removed with       a cold snare. Resection and retrieval were complete.      The exam of the duodenum was otherwise normal. Impression:               - 2 cm hiatal hernia.                           - Normal stomach. Biopsied.                           - Mass (suspected adenoma) in the second portion of                            the duodenum. Moderate Sedation:      Per Anesthesia Care Recommendation:           - Discharge patient to home (ambulatory).                           - Resume previous diet.                           - Await pathology results.                           - Continue daily omeprazole for at least 2 more  weeks.                           - Schedule CT angio abdomen and pelvis. Procedure Code(s):        --- Professional ---                           [phone], Esophagogastroduodenoscopy, flexible,                            transoral; with removal of tumor(s), polyp(s), or                            other lesion(s) by snare technique                           [id number], 35, Esophagogastroduodenoscopy, flexible,                            transoral; with biopsy, single or multiple Diagnosis Code(s):        --- Professional ---                           K44.9, Diaphragmatic hernia without obstruction or                            gangrene                           K31.89, Other diseases of  stomach and duodenum                           R10.13, Epigastric pain                           K31.7, Polyp of stomach and duodenum CPT copyright 2019 American Medical Association. All rights reserved. The codes documented in this report are preliminary and upon coder review may  be revised to meet current compliance requirements. [name], MD [name],  04/09/2022 10:41:52 AM This report has been signed electronically. Number of Addenda: 0

## 2022-04-09 NOTE — Telephone Encounter (Signed)
-----   Message from Harvel Quale, MD sent at 04/09/2022 10:15 AM EDT ----- Tomasita Morrow,   Can you please schedule a CT angio abdomen and pelvis? Dx: postprandial abdominal pain.  Thanks,  [name], MD Gastroenterology and Hepatology [facility]

## 2022-04-09 NOTE — H&P (Signed)
Caroline May is an 71 y.o. female.   Chief Complaint: duodenal adenoma HPI: Caroline May is a 71 y.o. female with past medical history of HTN, urolithiasis, GERD, anxiety, asthma, coming for therapy of duodenal adenoma.  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  Denies any symptoms such as Tnausea, vomiting, fever, chills, hematochezia, melena, hematemesis, abdominal distention, abdominal pain, diarrhea, jaundice, pruritus or weight loss.  Past Medical History:  Diagnosis Date   Anxiety    Asthma    GERD (gastroesophageal reflux disease)    History of kidney stones    Hypertension     Past Surgical History:  Procedure Laterality Date   APPENDECTOMY     BIOPSY  02/21/2022   Procedure: BIOPSY;  Surgeon: Harvel Quale, MD;  [facility];  Service: Gastroenterology;;   COLONOSCOPY N/A 10/08/2016   Procedure: COLONOSCOPY;  Surgeon: Rogene Houston, MD;  [facility];  Service: Endoscopy;  Laterality: N/A;  930   COLONOSCOPY WITH PROPOFOL N/A 01/03/2022   Procedure: COLONOSCOPY WITH PROPOFOL;  Surgeon: Harvel Quale, MD;  [facility];  Service: Gastroenterology;  Laterality: N/A;  730 ASA 1   ESOPHAGOGASTRODUODENOSCOPY (EGD) WITH PROPOFOL N/A 02/21/2022   Procedure: ESOPHAGOGASTRODUODENOSCOPY (EGD) WITH PROPOFOL;  Surgeon: Harvel Quale, MD;  [facility];  Service: Gastroenterology;  Laterality: N/A;  220, per Darius Bump pt moved up and knows new time   [place]    Family History  Problem Relation Age of Onset   Heart attack Mother    Colon cancer Father    Heart attack Father    Breast cancer Sister    Alzheimer's disease Sister    Parkinson's disease Brother    Social History:  reports that she has never smoked. She has never used smokeless tobacco. She reports that she does not currently use alcohol. She reports  that she does not use drugs.  Allergies:  Allergies  Allergen Reactions   Lisinopril Swelling   Sulfa Antibiotics Other (See Comments)    Causes the inside of mouth to become irritated    Azithromycin Palpitations    Raised blood pressure    Medications Prior to Admission  Medication Sig Dispense Refill   acetaminophen (TYLENOL) 500 MG tablet Take 500-1,000 mg by mouth every 6 (six) hours as needed (pain.).     atorvastatin (LIPITOR) 20 MG tablet Take 10 mg by mouth at bedtime.     carboxymethylcellulose (REFRESH PLUS) 0.5 % SOLN Place 1-2 drops into both eyes 3 (three) times daily as needed (dry/irritated eyes.).     cetirizine (ZYRTEC) 10 MG tablet Take 10 mg by mouth daily as needed for allergies.     Cholecalciferol (VITAMIN D) 2000 units tablet Take 2,000 Units by mouth daily with lunch.     famotidine-calcium carbonate-magnesium hydroxide (PEPCID COMPLETE) 10-800-165 MG chewable tablet Chew 1 tablet by mouth daily as needed (Acid in stomach).     ketoconazole (NIZORAL) 2 % cream Apply 1 application topically daily.     Krill Oil 350 MG CAPS Take 350 mg by mouth daily with lunch.     losartan (COZAAR) 100 MG tablet Take 100 mg by mouth at bedtime.     Multiple Vitamins-Minerals (MULTIVITAMIN WITH MINERALS) tablet Take 1 tablet by mouth daily with lunch.     omeprazole (PRILOSEC) 40 MG capsule Take 1 capsule (40  mg total) by mouth daily before breakfast. (Patient taking differently: Take 40 mg by mouth at bedtime.) 30 capsule 0   sucralfate (CARAFATE) 1 GM/10ML suspension Take 10 mLs (1 g total) by mouth 4 (four) times daily. (Patient taking differently: Take 1 g by mouth 4 (four) times daily as needed (Burning).) 420 mL 1   triamcinolone (NASACORT) 55 MCG/ACT AERO nasal inhaler Place 2 sprays into the nose daily as needed (allergies).     LORazepam (ATIVAN) 0.5 MG tablet Take 0.5 mg by mouth daily as needed for anxiety.      No results found for this or any previous visit (from the  past 48 hour(s)). No results found.  Review of Systems  Constitutional: Negative.   HENT: Negative.    Eyes: Negative.   Respiratory: Negative.    Cardiovascular: Negative.   Gastrointestinal: Negative.   Endocrine: Negative.   Genitourinary: Negative.   Musculoskeletal: Negative.   Skin: Negative.   Allergic/Immunologic: Negative.   Neurological: Negative.   Hematological: Negative.   Psychiatric/Behavioral: Negative.     Blood pressure (!) 163/76, temperature 97.7 F (36.5 C), temperature source Oral, resp. rate 18, height '5\' 5"'$  (1.651 m), weight 62.6 kg, SpO2 98 %. Physical Exam  GENERAL: The patient is AO x3, in no acute distress. HEENT: Head is normocephalic and atraumatic. EOMI are intact. Mouth is well hydrated and without lesions. NECK: Supple. No masses LUNGS: Clear to auscultation. No presence of rhonchi/wheezing/rales. Adequate chest expansion HEART: RRR, normal s1 and s2. ABDOMEN: Soft, nontender, no guarding, no peritoneal signs, and nondistended. BS +. No masses. EXTREMITIES: Without any cyanosis, clubbing, rash, lesions or edema. NEUROLOGIC: AOx3, no focal motor deficit. SKIN: no jaundice, no rashes  Assessment/Plan Caroline May is a 71 y.o. female with past medical history of HTN, urolithiasis, GERD, anxiety, asthma, coming for therapy of duodenal adenoma.  We will proceed with EGD.  Harvel Quale, MD 04/09/2022, 9:09 AM

## 2022-04-09 NOTE — Telephone Encounter (Signed)
Thanks

## 2022-04-09 NOTE — Anesthesia Preprocedure Evaluation (Signed)
Anesthesia Evaluation  Patient identified by MRN, date of birth, ID band Patient awake    Reviewed: Allergy & Precautions, H&P , NPO status , Patient's Chart, lab work & pertinent test results, reviewed documented beta blocker date and time   Airway Mallampati: II  TM Distance: >3 FB Neck ROM: full    Dental no notable dental hx.    Pulmonary asthma ,    Pulmonary exam normal breath sounds clear to auscultation       Cardiovascular Exercise Tolerance: Good hypertension, negative cardio ROS   Rhythm:regular Rate:Normal     Neuro/Psych PSYCHIATRIC DISORDERS Anxiety negative neurological ROS     GI/Hepatic Neg liver ROS, GERD  Medicated,  Endo/Other  negative endocrine ROS  Renal/GU negative Renal ROS  negative genitourinary   Musculoskeletal   Abdominal   Peds  Hematology negative hematology ROS (+)   Anesthesia Other Findings   Reproductive/Obstetrics negative OB ROS                             Anesthesia Physical Anesthesia Plan  ASA: 2  Anesthesia Plan: General   Post-op Pain Management:    Induction:   PONV Risk Score and Plan: Propofol infusion  Airway Management Planned:   Additional Equipment:   Intra-op Plan:   Post-operative Plan:   Informed Consent: I have reviewed the patients History and Physical, chart, labs and discussed the procedure including the risks, benefits and alternatives for the proposed anesthesia with the patient or authorized representative who has indicated his/her understanding and acceptance.     Dental Advisory Given  Plan Discussed with: CRNA  Anesthesia Plan Comments:         Anesthesia Quick Evaluation

## 2022-04-09 NOTE — Transfer of Care (Signed)
Immediate Anesthesia Transfer of Care Note  Patient: Caroline May  Procedure(s) Performed: ESOPHAGOGASTRODUODENOSCOPY (EGD) WITH PROPOFOL POLYPECTOMY BIOPSY  Patient Location: Endoscopy Unit  Anesthesia Type:MAC  Level of Consciousness: sedated and patient cooperative  Airway & Oxygen Therapy: Patient Spontanous Breathing and Patient connected to nasal cannula oxygen  Post-op Assessment: Report given to RN, Post -op Vital signs reviewed and stable and Patient moving all extremities  Post vital signs: Reviewed and stable  Last Vitals:  Vitals Value Taken Time  BP    Temp    Pulse    Resp    SpO2      Last Pain:  Vitals:   04/09/22 1013  TempSrc:   PainSc: 0-No pain      Patients Stated Pain Goal: 7 (94/07/68 0881)  Complications: No notable events documented.

## 2022-04-10 LAB — SURGICAL PATHOLOGY

## 2022-04-14 ENCOUNTER — Telehealth (INDEPENDENT_AMBULATORY_CARE_PROVIDER_SITE_OTHER): Payer: Self-pay

## 2022-04-14 ENCOUNTER — Other Ambulatory Visit (INDEPENDENT_AMBULATORY_CARE_PROVIDER_SITE_OTHER): Payer: Self-pay | Admitting: Gastroenterology

## 2022-04-14 DIAGNOSIS — K219 Gastro-esophageal reflux disease without esophagitis: Secondary | ICD-10-CM

## 2022-04-14 MED ORDER — OMEPRAZOLE 20 MG PO CPDR: 20.0000 mg | DELAYED_RELEASE_CAPSULE | Freq: Every day | ORAL | 3 refills | Status: DC

## 2022-04-14 NOTE — Telephone Encounter (Signed)
Patient called stating she has noticed the last few days around 30 minutes after taking her Omeprazole 40 mg that her throat is swelling or there is a tightness in her throat. She has taken an benadryl. She has no shortness of breath, or rash. She says she was recently changed from Omeprazole 20 mg daily to 40 mg daily , and had no issues with the 20 mgs.This was done at the Ed visit on 02/21/2022.She says she is not taking any other medication other than a bp medication that she has been on for over a year. Please advise she uses [facility]. Her las Egd was 04/09/2022.

## 2022-04-14 NOTE — Telephone Encounter (Signed)
Has she felt any improvement in her symptoms after switching to 40 mg? If not, she can go down to 20 mg dosing she was using in the past

## 2022-04-14 NOTE — Telephone Encounter (Signed)
Patient states she thought she was doing well until she started having this feeling in her throat. She says she has issues with a lot of medications. She had been on a bp med years ago that did this same thing to her. She does not have any more omeprazole 20 mg left and will need this sent to [facility] in [place], [place].

## 2022-04-14 NOTE — Telephone Encounter (Signed)
Medication sent to pharmacy  

## 2022-04-15 ENCOUNTER — Other Ambulatory Visit (HOSPITAL_BASED_OUTPATIENT_CLINIC_OR_DEPARTMENT_OTHER): Payer: Medicare Other

## 2022-04-15 ENCOUNTER — Encounter (INDEPENDENT_AMBULATORY_CARE_PROVIDER_SITE_OTHER): Payer: Self-pay

## 2022-04-15 NOTE — Telephone Encounter (Signed)
Patient aware of all.

## 2022-04-16 ENCOUNTER — Encounter (HOSPITAL_COMMUNITY): Payer: Self-pay | Admitting: Gastroenterology

## 2022-04-21 ENCOUNTER — Telehealth (INDEPENDENT_AMBULATORY_CARE_PROVIDER_SITE_OTHER): Payer: Self-pay

## 2022-04-21 NOTE — Telephone Encounter (Signed)
If she is still having pain yes, but if her symptoms resolved after stopping the medication, she can call and cancel it

## 2022-04-21 NOTE — Telephone Encounter (Signed)
Patient called to report that she had resumed the omeprazole 20 mg and had the same issue with the feeling of swelling in her throat, so she stopped this on Friday. Since stopping she says symptoms have cleared.  She has a ct scan tomorrow and wants to know if she needs to keep it as she feels the medication was causing her issues.  Also what else can she take for reflux? Please advise.

## 2022-04-21 NOTE — Telephone Encounter (Signed)
If her symptoms (including abdominal pain) have resolved after stopping omeprazole, she may be intolerant to the medication. If she is not having any heartburn or chest pain symptoms, she does not need to take any medicine, but if having these symptoms she can take famotidine 20 mg every night.

## 2022-04-21 NOTE — Telephone Encounter (Signed)
What about the CT Scan for tomorrow Still do it?

## 2022-04-21 NOTE — Telephone Encounter (Signed)
Patient states she will call and cancel the test as she feels she is doing well after stopping the meds. She is aware she can use famotidine prn for break through symptoms.

## 2022-04-22 ENCOUNTER — Ambulatory Visit (HOSPITAL_BASED_OUTPATIENT_CLINIC_OR_DEPARTMENT_OTHER): Payer: Medicare Other

## 2022-05-15 ENCOUNTER — Other Ambulatory Visit (HOSPITAL_BASED_OUTPATIENT_CLINIC_OR_DEPARTMENT_OTHER): Payer: Medicare Other

## 2022-05-31 ENCOUNTER — Telehealth (INDEPENDENT_AMBULATORY_CARE_PROVIDER_SITE_OTHER): Payer: Self-pay | Admitting: Gastroenterology

## 2022-05-31 NOTE — Telephone Encounter (Signed)
Hi Caroline May,  Can you please call the patient and tell the patient I received the results of the most recent CT angio of the abdomen and pelvis performed a [facility]?  There were no abnormalities in the arteries or the organs in the abdomen per the report.  Ideally she should bring these images (DCD) so we can review the images ourselves.  [facility], she has an appointment with you on the 25th.  Thanks,  [name], MD Gastroenterology and Hepatology [facility]

## 2022-06-02 NOTE — Telephone Encounter (Signed)
Called and discussed with patient per Dr. Jenetta Downer -  There were no abnormalities in the arteries or the organs in the abdomen per the report.  Ideally she should bring these images (DCD) so we can review the images ourselves.   Patient verbalized understanding and she states she will call [facility] to get the CD with images to bring to appt tomorrow.

## 2022-06-03 ENCOUNTER — Encounter (INDEPENDENT_AMBULATORY_CARE_PROVIDER_SITE_OTHER): Payer: Self-pay | Admitting: Gastroenterology

## 2022-06-03 ENCOUNTER — Ambulatory Visit (INDEPENDENT_AMBULATORY_CARE_PROVIDER_SITE_OTHER): Payer: Medicare Other | Admitting: Gastroenterology

## 2022-06-03 ENCOUNTER — Encounter (INDEPENDENT_AMBULATORY_CARE_PROVIDER_SITE_OTHER): Payer: Self-pay

## 2022-06-03 VITALS — BP 112/68 | HR 61 | Temp 98.1°F | Ht 65.0 in | Wt 140.1 lb

## 2022-06-03 DIAGNOSIS — K219 Gastro-esophageal reflux disease without esophagitis: Secondary | ICD-10-CM | POA: Diagnosis not present

## 2022-06-03 MED ORDER — PANTOPRAZOLE SODIUM 40 MG PO TBEC: 40.0000 mg | DELAYED_RELEASE_TABLET | Freq: Every day | ORAL | 1 refills | Status: DC

## 2022-06-03 NOTE — Patient Instructions (Signed)
I have sent pantoprazole '40mg'$  to your pharmacy Please take this 30 minutes prior to breakfast Avoid greasy, spicy, fried, citrus foods, and be mindful that caffeine, carbonated drinks, chocolate and alcohol can increase reflux symptoms Stay upright 2-3 hours after eating, prior to lying down and avoid eating late in the evenings.  Continue pepcid '20mg'$  QHS   Follow up 3 months

## 2022-06-03 NOTE — Progress Notes (Signed)
Referring Provider: Pomposini, Cherly Anderson, MD Primary Care Physician:  Clinton Quant, MD Primary GI Physician: Jenetta Downer   Chief Complaint  Patient presents with   Results    Follow up on CT results and tenderness in esophagus.    HPI:   Caroline May is a 71 y.o. female with past medical history of HTN, urolithiasis, GERD, anxiety, asthma.   Patient presenting today for follow up of epigastric pain/burning in chest.   Last seen 02/04/22 with recent visit to ED at [facility] on 01/27/22 for burning chest pain was taking omeprazole '20mg'$  and pepcid '20mg'$  in the evening without relief of symptoms. Made her PCP aware and they recommended she proceed to ED for further evaluation to r/o cardiac etiology.  Also endorsed bilateral arm weakness since November. Had MR cervical spine ordered, however this was not completed, omeprazole increased from '20mg'$  to '40mg'$  daily, as cardiac workup was negative. Reports some improvement in pain since PPI was increased. She continues to take her otc pepcid before dinner each night. noticing an uncomfortable sensation in epigastric region sometimes after eating with some occasional discomfort to LUQ.  cut out a lot of salty, greasy, fried foods. She was taking mylanta for symptom relief which helped a lot. not taking any NSAIDs. Drinks alcohol very rarely and does report that she cannot drink red wine as it causes severe acid regurgitation.   She was scheduled for EGD, continued on omeprazole '40mg'$  once daily, Rx carafate 1g TID and QHS, pepcid stopped. EGD as outlined below. Patient recommended to undergo CT angio A/P though this was done at an outside facility and report is unavailable.   Present: She reports ongoing burning in her chest/epigastric region, she feels that heartburn has improved some but is still ongoing. She is not taking the omeprazole at this time as she states when she was switched from '20mg'$  to '40mg'$  of omeprazole she felt that her throat was swelling and  she was losing weight. She is taking pepcid '20mg'$  nightly. She takes tums for her heartburn multiple times per week.  Denies dysphagia, odynophagia. Appetite is good, she has gained back previously lost weight. She reports that she previously was started on omeprazole many years ago after seeing ENT for an ongoing sore throat thought secondary to reflux. She previously also took nexium though was stopped on this as her PCP was concerned due to reports of this contributing to alzheimers. She is trying to watch what she eats, has weakened her coffee and is doing decaf without much change.   EGD 02/21/22 1 cm hiatal hernia. - Normal stomach. - Mucosal nodule found in the duodenum. Biopsied-tubular adenoma  EGD 04/09/22- 2 cm hiatal hernia. - Normal stomach. Biopsied. - Mass (suspected adenoma) in the second portion of the duodenum.  Last Colonoscopy:01/03/22 - The entire examined colon is normal. - Non-bleeding internal hemorrhoids. - No specimens collected.  Past Medical History:  Diagnosis Date   Anxiety    Asthma    GERD (gastroesophageal reflux disease)    History of kidney stones    Hypertension     Past Surgical History:  Procedure Laterality Date   APPENDECTOMY     BIOPSY  02/21/2022   Procedure: BIOPSY;  Surgeon: Harvel Quale, MD;  [facility];  Service: Gastroenterology;;   BIOPSY  04/09/2022   Procedure: BIOPSY;  Surgeon: Harvel Quale, MD;  [facility];  Service: Gastroenterology;;   COLONOSCOPY N/A 10/08/2016   Procedure: COLONOSCOPY;  Surgeon: Rogene Houston, MD;  [facility];  Service: Endoscopy;  Laterality: N/A;  930   COLONOSCOPY WITH PROPOFOL N/A 01/03/2022   Procedure: COLONOSCOPY WITH PROPOFOL;  Surgeon: Harvel Quale, MD;  [facility];  Service: Gastroenterology;  Laterality: N/A;  730 ASA 1   ESOPHAGOGASTRODUODENOSCOPY (EGD) WITH PROPOFOL N/A 02/21/2022   Procedure:  ESOPHAGOGASTRODUODENOSCOPY (EGD) WITH PROPOFOL;  Surgeon: Harvel Quale, MD;  [facility];  Service: Gastroenterology;  Laterality: N/A;  220, per Darius Bump pt moved up and knows new time   ESOPHAGOGASTRODUODENOSCOPY (EGD) WITH PROPOFOL N/A 04/09/2022   Procedure: ESOPHAGOGASTRODUODENOSCOPY (EGD) WITH PROPOFOL;  Surgeon: Harvel Quale, MD;  [facility];  Service: Gastroenterology;  Laterality: N/A;  10:00   POLYPECTOMY  04/09/2022   Procedure: POLYPECTOMY;  Surgeon: Harvel Quale, MD;  [facility];  Service: Gastroenterology;;   TONSILLECTOMY     TUBAL LIGATION  1984    Current Outpatient Medications  Medication Sig Dispense Refill   acetaminophen (TYLENOL) 325 MG tablet Take by mouth. Takes as needed     atorvastatin (LIPITOR) 10 MG tablet Take 10 mg by mouth at bedtime.     carboxymethylcellulose (REFRESH PLUS) 0.5 % SOLN Place 1-2 drops into both eyes 3 (three) times daily as needed (dry/irritated eyes.).     cetirizine (ZYRTEC) 10 MG tablet Take 10 mg by mouth daily as needed for allergies.     Cholecalciferol (VITAMIN D) 2000 units tablet Take 2,000 Units by mouth daily with lunch.     famotidine-calcium carbonate-magnesium hydroxide (PEPCID COMPLETE) 10-800-165 MG chewable tablet Chew 1 tablet by mouth. One every night     ketoconazole (NIZORAL) 2 % cream Apply 1 application topically daily.     LORazepam (ATIVAN) 0.5 MG tablet Take 0.5 mg by mouth daily as needed for anxiety.     losartan (COZAAR) 100 MG tablet Take 100 mg by mouth at bedtime.     Multiple Vitamins-Minerals (MULTIVITAMIN WITH MINERALS) tablet Take 1 tablet by mouth daily with lunch.     Probiotic Product (PROBIOTIC DAILY PO) Take by mouth. Superior probiotic 3% prebiotic one daily.     sucralfate (CARAFATE) 1 GM/10ML suspension Take 10 mLs (1 g total) by mouth 4 (four) times daily. (Patient taking differently: Take 1 g by mouth 4 (four) times daily as  needed (Burning).) 420 mL 1   triamcinolone (NASACORT) 55 MCG/ACT AERO nasal inhaler Place 2 sprays into the nose daily as needed (allergies).     omeprazole (PRILOSEC) 20 MG capsule Take 1 capsule (20 mg total) by mouth daily. (Patient not taking: Reported on 06/03/2022) 90 capsule 3   No current facility-administered medications for this visit.    Allergies as of 06/03/2022 - Review Complete 06/03/2022  Allergen Reaction Noted   Lisinopril Swelling 01/27/2022   Omeprazole Swelling 04/15/2022   Sulfa antibiotics Other (See Comments) 10/03/2016   Azithromycin Palpitations 10/23/2021    Family History  Problem Relation Age of Onset   Heart attack Mother    Colon cancer Father    Heart attack Father    Breast cancer Sister    Alzheimer's disease Sister    Parkinson's disease Brother     Social History   Socioeconomic History   Marital status: Married    Spouse name: Not on file   Number of children: Not on file   Years of education: Not on file   Highest education level: Not on file  Occupational  History   Not on file  Tobacco Use   Smoking status: Never    Passive exposure: Past   Smokeless tobacco: Never  Vaping Use   Vaping Use: Never used  Substance and Sexual Activity   Alcohol use: Not Currently    Comment: occasional glass of wine   Drug use: No   Sexual activity: Not on file  Other Topics Concern   Not on file  Social History Narrative   Not on file   Social Determinants of Health   Financial Resource Strain: Not on file  Food Insecurity: Not on file  Transportation Needs: Not on file  Physical Activity: Not on file  Stress: Not on file  Social Connections: Not on file   Review of systems General: negative for malaise, night sweats, fever, chills, weight loss Neck: Negative for lumps, goiter, pain and significant neck swelling Resp: Negative for cough, wheezing, dyspnea at rest CV: Negative for chest pain, leg swelling, palpitations, orthopnea GI:  denies melena, hematochezia, nausea, vomiting, diarrhea, constipation, dysphagia, odyonophagia, early satiety or unintentional weight loss. +heartburn MSK: Negative for joint pain or swelling, back pain, and muscle pain. Derm: Negative for itching or rash Psych: Denies depression, anxiety, memory loss, confusion. No homicidal or suicidal ideation.  Heme: Negative for prolonged bleeding, bruising easily, and swollen nodes. Endocrine: Negative for cold or heat intolerance, polyuria, polydipsia and goiter. Neuro: negative for tremor, gait imbalance, syncope and seizures. The remainder of the review of systems is noncontributory.  Physical Exam: Ht '5\' 5"'$  (1.651 m)   Wt 140 lb 1.6 oz (63.5 kg)   BMI 23.31 kg/m  General:   Alert and oriented. No distress noted. Pleasant and cooperative.  Head:  Normocephalic and atraumatic. Eyes:  Conjuctiva clear without scleral icterus. Mouth:  Oral mucosa pink and moist. Good dentition. No lesions. Heart: Normal rate and rhythm, s1 and s2 heart sounds present.  Lungs: Clear lung sounds in all lobes. Respirations equal and unlabored. Abdomen:  +BS, soft, non-tender and non-distended. No rebound or guarding. No HSM or masses noted. Derm: No palmar erythema or jaundice Msk:  Symmetrical without gross deformities. Normal posture. Extremities:  Without edema. Neurologic:  Alert and  oriented x4 Psych:  Alert and cooperative. Normal mood and affect.  Invalid input(s): "6 MONTHS"   ASSESSMENT: Caroline May is a 71 y.o. female presenting today for follow up of GERD.  Continued heartburn, was not able to tolerate higher dose of omeprazole previously as she felt like she was losing weight and having swelling in her throat on this. She is currently only on pepcid '20mg'$  QHS, has made dietary alterations and is trying to avoid trigger foods but continues with frequent burning in her chest/epigastric area, using tums a few times per week. Discussed initiation of a  different PPI as GERD symptoms are not well controlled on H2B alone. Patient is hesitant to try nexium again as previous reports showed some concern about this causing alzheimers, Patient was re-assured regarding PPI use and safety of when appropriate indications in question. Most recent studies on PPI therapy that association of symptoms is not equivalent to causation and overall association with for example osteoporosis is weak and based on observational studies. When PPI use is indicated, it is safe to proceed with therapy and titrate dosing/use based on symptom response. Will start pantoprazole '40mg'$  once daily. She should continue with reflux precautions and avoiding triggers.   No red flag symptoms. Patient denies melena, hematochezia, nausea, vomiting, diarrhea, constipation,  dysphagia, odyonophagia, early satiety or weight loss.   PLAN:  Rx pantoprazole '40mg'$  daily  2. Continue pepcid '20mg'$  QHS  3. Continue with reflux precautions   All questions were answered, patient verbalized understanding and is in agreement with plan as outlined above.    Follow Up: 3 MONTHS   Ruston Fedora L. Alver Sorrow, MSN, APRN, AGNP-C Adult-Gerontology Nurse Practitioner [facility]

## 2022-06-09 ENCOUNTER — Telehealth (INDEPENDENT_AMBULATORY_CARE_PROVIDER_SITE_OTHER): Payer: Self-pay

## 2022-06-09 NOTE — Telephone Encounter (Signed)
Caroline Quale, MD  [name], CMA; [name], NP Hi Caroline May,   Can you please call the patient and tell the patient I reviewed the imaging the with Dr. Thornton Papas was normal? No pathology was present.   Thanks,   [name], MD  Gastroenterology and Hepatology  [facility]

## 2022-06-09 NOTE — Telephone Encounter (Signed)
I called and left a message asked that the patient please return call.  

## 2022-06-09 NOTE — Telephone Encounter (Signed)
Patient made aware of all.  

## 2022-09-04 ENCOUNTER — Ambulatory Visit (INDEPENDENT_AMBULATORY_CARE_PROVIDER_SITE_OTHER): Payer: Medicare Other | Admitting: Gastroenterology

## 2022-09-15 ENCOUNTER — Encounter (INDEPENDENT_AMBULATORY_CARE_PROVIDER_SITE_OTHER): Payer: Self-pay | Admitting: Gastroenterology

## 2022-09-15 ENCOUNTER — Ambulatory Visit (INDEPENDENT_AMBULATORY_CARE_PROVIDER_SITE_OTHER): Payer: Medicare Other | Admitting: Gastroenterology

## 2022-09-15 VITALS — BP 148/68 | HR 72 | Temp 97.5°F | Ht 65.0 in | Wt 145.2 lb

## 2022-09-15 DIAGNOSIS — K589 Irritable bowel syndrome without diarrhea: Secondary | ICD-10-CM | POA: Insufficient documentation

## 2022-09-15 DIAGNOSIS — K219 Gastro-esophageal reflux disease without esophagitis: Secondary | ICD-10-CM

## 2022-09-15 DIAGNOSIS — K581 Irritable bowel syndrome with constipation: Secondary | ICD-10-CM

## 2022-09-15 NOTE — Patient Instructions (Addendum)
Continue pantoprazole 40 mg qday Can continue probiotics daily

## 2022-09-15 NOTE — Progress Notes (Signed)
Caroline May, M.D. Gastroenterology & Hepatology [facility] [address] [place], [place] [address]  Primary Care Physician: Pomposini, Cherly Anderson, MD No address on file  I will communicate my assessment and recommendations to the referring MD via EMR.  Problems: GERD  History of Present Illness: Caroline May is a 71 y.o. female with past medical history of HTN, urolithiasis, GERD, anxiety, asthma, coming for follow up of GERD and constipation.  The patient was last seen on 06/03/2022. At that time, the patient was advised to take pantoprazole 40 mg daily and Pepcid 20 mg at night.  Patient reports that she had to stop Prilosec as it was making her constipated. She started Superio Probiotic from [employer], which has made her have a daily bowel movements. Has been taking the medication for the last 3 months. States she feels better with the probiotics but does not know if she should keep taking it.  She is currently taking pantoprazole 40 mg every day. No dysphagia, odynophagia or dysphagia. Not taking any Pepcid now as she felt the burning chest sensation resolved.  The patient denies having any nausea, vomiting, fever, chills, hematochezia, melena, hematemesis, abdominal distention, abdominal pain, diarrhea, jaundice, pruritus or weight loss.  EGD 02/21/22 1 cm hiatal hernia. - Normal stomach. - Mucosal nodule found in the duodenum. Biopsied-tubular adenoma   EGD 04/09/22- 2 cm hiatal hernia. - Normal stomach. Biopsied. - Mass (suspected adenoma) in the second portion of the duodenum.   Last Colonoscopy:01/03/22 - The entire examined colon is normal. - Non-bleeding internal hemorrhoids. - No specimens collected.  Past Medical History: Past Medical History:  Diagnosis Date   Anxiety    Asthma    GERD (gastroesophageal reflux disease)    History of kidney stones    Hypertension     Past Surgical History: Past Surgical History:   Procedure Laterality Date   APPENDECTOMY     BIOPSY  02/21/2022   Procedure: BIOPSY;  Surgeon: Harvel Quale, MD;  [facility];  Service: Gastroenterology;;   BIOPSY  04/09/2022   Procedure: BIOPSY;  Surgeon: Harvel Quale, MD;  [facility];  Service: Gastroenterology;;   COLONOSCOPY N/A 10/08/2016   Procedure: COLONOSCOPY;  Surgeon: Rogene Houston, MD;  [facility];  Service: Endoscopy;  Laterality: N/A;  930   COLONOSCOPY WITH PROPOFOL N/A 01/03/2022   Procedure: COLONOSCOPY WITH PROPOFOL;  Surgeon: Harvel Quale, MD;  [facility];  Service: Gastroenterology;  Laterality: N/A;  730 ASA 1   ESOPHAGOGASTRODUODENOSCOPY (EGD) WITH PROPOFOL N/A 02/21/2022   Procedure: ESOPHAGOGASTRODUODENOSCOPY (EGD) WITH PROPOFOL;  Surgeon: Harvel Quale, MD;  [facility];  Service: Gastroenterology;  Laterality: N/A;  220, per Darius Bump pt moved up and knows new time   ESOPHAGOGASTRODUODENOSCOPY (EGD) WITH PROPOFOL N/A 04/09/2022   Procedure: ESOPHAGOGASTRODUODENOSCOPY (EGD) WITH PROPOFOL;  Surgeon: Harvel Quale, MD;  [facility];  Service: Gastroenterology;  Laterality: N/A;  10:00   POLYPECTOMY  04/09/2022   Procedure: POLYPECTOMY;  Surgeon: Harvel Quale, MD;  [facility];  Service: Gastroenterology;;   TONSILLECTOMY     TUBAL LIGATION  1984    Family History: Family History  Problem Relation Age of Onset   Heart attack Mother    Colon cancer Father    Heart attack Father    Breast cancer Sister    Alzheimer's disease Sister    Parkinson's disease Brother  Social History: Social History   Tobacco Use  Smoking Status Never   Passive exposure: Past  Smokeless Tobacco Never   Social History   Substance and Sexual Activity  Alcohol Use Not Currently   Comment: occasional glass of wine   Social History   Substance and Sexual Activity   Drug Use No    Allergies: Allergies  Allergen Reactions   Lisinopril Swelling   Omeprazole Swelling    Per patient only the Omeprazole 40 mg's does this.    Sulfa Antibiotics Other (See Comments)    Causes the inside of mouth to become irritated    Azithromycin Palpitations    Raised blood pressure    Medications: Current Outpatient Medications  Medication Sig Dispense Refill   acetaminophen (TYLENOL) 325 MG tablet Take by mouth. Takes as needed     atorvastatin (LIPITOR) 10 MG tablet Take 20 mg by mouth at bedtime.     Biotin 10 MG CHEW Chew by mouth daily at 6 (six) AM.     carboxymethylcellulose (REFRESH PLUS) 0.5 % SOLN Place 1-2 drops into both eyes 3 (three) times daily as needed (dry/irritated eyes.).     cetirizine (ZYRTEC) 10 MG tablet Take 10 mg by mouth daily as needed for allergies.     Cholecalciferol (VITAMIN D) 2000 units tablet Take 2,000 Units by mouth daily with lunch.     ketoconazole (NIZORAL) 2 % cream Apply 1 application topically daily.     LORazepam (ATIVAN) 0.5 MG tablet Take 0.5 mg by mouth daily as needed for anxiety.     losartan (COZAAR) 100 MG tablet Take 100 mg by mouth at bedtime.     Multiple Vitamins-Minerals (MULTIVITAMIN WITH MINERALS) tablet Take 1 tablet by mouth daily with lunch.     pantoprazole (PROTONIX) 40 MG tablet Take 1 tablet (40 mg total) by mouth daily. 30 tablet 1   Probiotic Product (PROBIOTIC DAILY PO) Take by mouth. Superior probiotic 3% prebiotic one daily.     triamcinolone (NASACORT) 55 MCG/ACT AERO nasal inhaler Place 2 sprays into the nose daily as needed (allergies).     No current facility-administered medications for this visit.    Review of Systems: GENERAL: negative for malaise, night sweats HEENT: No changes in hearing or vision, no nose bleeds or other nasal problems. NECK: Negative for lumps, goiter, pain and significant neck swelling RESPIRATORY: Negative for cough, wheezing CARDIOVASCULAR: Negative for chest  pain, leg swelling, palpitations, orthopnea GI: SEE HPI MUSCULOSKELETAL: Negative for joint pain or swelling, back pain, and muscle pain. SKIN: Negative for lesions, rash PSYCH: Negative for sleep disturbance, mood disorder and recent psychosocial stressors. HEMATOLOGY Negative for prolonged bleeding, bruising easily, and swollen nodes. ENDOCRINE: Negative for cold or heat intolerance, polyuria, polydipsia and goiter. NEURO: negative for tremor, gait imbalance, syncope and seizures. The remainder of the review of systems is noncontributory.   Physical Exam: BP (!) 148/68 (BP Location: Right Arm, Patient Position: Sitting, Cuff Size: Large)   Pulse 72   Temp (!) 97.5 F (36.4 C) (Temporal)   Ht '5\' 5"'$  (1.651 m)   Wt 145 lb 3.2 oz (65.9 kg)   BMI 24.16 kg/m  GENERAL: The patient is AO x3, in no acute distress. HEENT: Head is normocephalic and atraumatic. EOMI are intact. Mouth is well hydrated and without lesions. NECK: Supple. No masses LUNGS: Clear to auscultation. No presence of rhonchi/wheezing/rales. Adequate chest expansion HEART: RRR, normal s1 and s2. ABDOMEN: Soft, nontender, no guarding, no peritoneal signs, and nondistended.  BS +. No masses. EXTREMITIES: Without any cyanosis, clubbing, rash, lesions or edema. NEUROLOGIC: AOx3, no focal motor deficit. SKIN: no jaundice, no rashes  Imaging/Labs: as above  I personally reviewed and interpreted the available labs, imaging and endoscopic files.  Impression and Plan: Caroline May is a 71 y.o. female with past medical history of HTN, urolithiasis, GERD, anxiety, asthma, coming for follow up of GERD and constipation.   Her chest pain likely was related to GERD, which has improved with the use of PPI daily without rescue doses of Pepcid. Will continue this regimen for now. I suspect there could be a concomitant bowel hypersensitivity component, but fortunately this has improved.  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 Fortunately, this improved with the use of OTC probiotics. She can continue using them as of now.  -Continue pantoprazole 40 mg qday -Can continue probiotics daily  All questions were answered.      Caroline Peppers, MD Gastroenterology and Hepatology [facility]

## 2023-02-16 ENCOUNTER — Other Ambulatory Visit (INDEPENDENT_AMBULATORY_CARE_PROVIDER_SITE_OTHER): Payer: Self-pay | Admitting: Gastroenterology

## 2023-02-16 NOTE — Telephone Encounter (Signed)
Last seen 11/23

## 2023-05-18 ENCOUNTER — Encounter (INDEPENDENT_AMBULATORY_CARE_PROVIDER_SITE_OTHER): Payer: Self-pay | Admitting: Gastroenterology

## 2023-05-18 ENCOUNTER — Ambulatory Visit (INDEPENDENT_AMBULATORY_CARE_PROVIDER_SITE_OTHER): Payer: Medicare Other | Admitting: Gastroenterology

## 2023-05-18 VITALS — BP 116/75 | HR 61 | Temp 97.8°F | Ht 65.0 in | Wt 144.3 lb

## 2023-05-18 DIAGNOSIS — R0989 Other specified symptoms and signs involving the circulatory and respiratory systems: Secondary | ICD-10-CM | POA: Diagnosis not present

## 2023-05-18 DIAGNOSIS — K219 Gastro-esophageal reflux disease without esophagitis: Secondary | ICD-10-CM

## 2023-05-18 NOTE — Progress Notes (Signed)
Katrinka Blazing, M.D. Gastroenterology & Hepatology [facility] [address] [place], [place] [address]  Primary Care Physician: Pomposini, Rande Brunt, MD No address on file  I will communicate my assessment and recommendations to the referring MD via EMR.  Problems: GERD Possible LPR   History of Present Illness: Caroline May is a 72 y.o. female with past medical history of HTN, urolithiasis, GERD, anxiety, asthma, coming for follow up of throat clearing.   The patient was last seen on 09/15/22. At that time, the patient was continued on pantoprazole 40 mg qday.  Patient reports that in January 2024 she presented new onset of constant throat clearing. She was given a course of antibiotics and she improved for a month, but her symptoms recurred. States most of her symptoms happen after having a meal, but has had a few episodes when fasting. Has felt some regurgitation of "phlegm like substance".  States that sometimes she feels some foods triggers her symptoms.  Notably, the patient was seen by ENT recently and was prescribed famotidine 20 mg at bedtime for management of constant throat clearing. Has not noticed any difference with the use of Pepcid. States she had some food allergy testing and only had abnormal testing to corn.  The patient denies having any dysphagia, odynophagia, nausea, vomiting, fever, chills, hematochezia, melena, hematemesis, abdominal distention, abdominal pain, diarrhea, jaundice, pruritus or weight loss.  Patient takes pantoprazole 40 mg before breakfast.  Uses Zyrtec frequently for nasal allergies. Also uses Nasacort as needed.  EGD 02/21/22 1 cm hiatal hernia. - Normal stomach. - Mucosal nodule found in the duodenum. Biopsied-tubular adenoma   EGD 04/09/22- 2 cm hiatal hernia. - Normal stomach. Biopsied.  Pathology showed reactive gastropathy without H. pylori. - Mass (suspected adenoma) in the second portion  of the duodenum. Removed with cold snare - path tubular adenoma.   Last Colonoscopy:01/03/22 - The entire examined colon is normal. - Non-bleeding internal hemorrhoids. - No specimens collected.  Past Medical History: Past Medical History:  Diagnosis Date   Anxiety    Asthma    GERD (gastroesophageal reflux disease)    History of kidney stones    Hypertension     Past Surgical History: Past Surgical History:  Procedure Laterality Date   APPENDECTOMY     BIOPSY  02/21/2022   Procedure: BIOPSY;  Surgeon: Dolores Frame, MD;  [facility];  Service: Gastroenterology;;   BIOPSY  04/09/2022   Procedure: BIOPSY;  Surgeon: Dolores Frame, MD;  [facility];  Service: Gastroenterology;;   COLONOSCOPY N/A 10/08/2016   Procedure: COLONOSCOPY;  Surgeon: Malissa Hippo, MD;  [facility];  Service: Endoscopy;  Laterality: N/A;  930   COLONOSCOPY WITH PROPOFOL N/A 01/03/2022   Procedure: COLONOSCOPY WITH PROPOFOL;  Surgeon: Dolores Frame, MD;  [facility];  Service: Gastroenterology;  Laterality: N/A;  730 ASA 1   ESOPHAGOGASTRODUODENOSCOPY (EGD) WITH PROPOFOL N/A 02/21/2022   Procedure: ESOPHAGOGASTRODUODENOSCOPY (EGD) WITH PROPOFOL;  Surgeon: Dolores Frame, MD;  [facility];  Service: Gastroenterology;  Laterality: N/A;  220, per Soledad Gerlach pt moved up and knows new time   ESOPHAGOGASTRODUODENOSCOPY (EGD) WITH PROPOFOL N/A 04/09/2022   Procedure: ESOPHAGOGASTRODUODENOSCOPY (EGD) WITH PROPOFOL;  Surgeon: Dolores Frame, MD;  [facility];  Service: Gastroenterology;  Laterality: N/A;  10:00   POLYPECTOMY  04/09/2022   Procedure: POLYPECTOMY;  Surgeon: Dolores Frame, MD;  [facility];  Service: Gastroenterology;;   TONSILLECTOMY     TUBAL LIGATION  1984    Family History: Family History  Problem Relation Age of Onset   Heart attack Mother    Colon  cancer Father    Heart attack Father    Breast cancer Sister    Alzheimer's disease Sister    Parkinson's disease Brother     Social History: Social History   Tobacco Use  Smoking Status Never   Passive exposure: Past  Smokeless Tobacco Never   Social History   Substance and Sexual Activity  Alcohol Use Not Currently   Comment: occasional glass of wine   Social History   Substance and Sexual Activity  Drug Use No    Allergies: Allergies  Allergen Reactions   Lisinopril Swelling   Omeprazole Swelling    Per patient only the Omeprazole 40 mg's does this.    Sulfa Antibiotics Other (See Comments)    Causes the inside of mouth to become irritated    Azithromycin Palpitations    Raised blood pressure    Medications: Current Outpatient Medications  Medication Sig Dispense Refill   acetaminophen (TYLENOL) 325 MG tablet Take by mouth. Takes as needed     atorvastatin (LIPITOR) 10 MG tablet Take 20 mg by mouth at bedtime.     Biotin 10 MG CHEW Chew by mouth daily at 6 (six) AM.     carboxymethylcellulose (REFRESH PLUS) 0.5 % SOLN Place 1-2 drops into both eyes 3 (three) times daily as needed (dry/irritated eyes.).     cetirizine (ZYRTEC) 10 MG tablet Take 10 mg by mouth daily as needed for allergies.     Cholecalciferol (VITAMIN D) 2000 units tablet Take 2,000 Units by mouth daily with lunch.     famotidine (PEPCID) 20 MG tablet Take 20 mg by mouth at bedtime.     ketoconazole (NIZORAL) 2 % cream Apply 1 application topically daily.     LORazepam (ATIVAN) 0.5 MG tablet Take 0.5 mg by mouth daily as needed for anxiety.     losartan (COZAAR) 100 MG tablet Take 100 mg by mouth at bedtime.     Multiple Vitamins-Minerals (MULTIVITAMIN WITH MINERALS) tablet Take 1 tablet by mouth daily with lunch.     pantoprazole (PROTONIX) 40 MG tablet TAKE ONE TABLET BY MOUTH EVERY DAY 90 tablet 1   Probiotic Product (PROBIOTIC DAILY PO) Take by mouth. Superior probiotic 3% prebiotic one  daily.     triamcinolone (NASACORT) 55 MCG/ACT AERO nasal inhaler Place 2 sprays into the nose daily as needed (allergies).     No current facility-administered medications for this visit.    Review of Systems: GENERAL: negative for malaise, night sweats HEENT: No changes in hearing or vision, no nose bleeds or other nasal problems. NECK: Negative for lumps, goiter, pain and significant neck swelling RESPIRATORY: Negative for cough, wheezing CARDIOVASCULAR: Negative for chest pain, leg swelling, palpitations, orthopnea GI: SEE HPI MUSCULOSKELETAL: Negative for joint pain or swelling, back pain, and muscle pain. SKIN: Negative for lesions, rash PSYCH: Negative for sleep disturbance, mood disorder and recent psychosocial stressors. HEMATOLOGY Negative for prolonged bleeding, bruising easily, and swollen nodes. ENDOCRINE: Negative for cold or heat intolerance, polyuria, polydipsia and goiter. NEURO: negative for tremor, gait imbalance, syncope and seizures. The remainder of the review of systems is noncontributory.   Physical Exam: BP 116/75 (BP Location: Left Arm, Patient Position: Sitting, Cuff Size: Normal)   Pulse 61   Temp 97.8 F (36.6 C) (Temporal)  Ht 5\' 5"  (1.651 m)   Wt 144 lb 4.8 oz (65.5 kg)   BMI 24.01 kg/m  GENERAL: The patient is AO x3, in no acute distress. HEENT: Head is normocephalic and atraumatic. EOMI are intact. Mouth is well hydrated and without lesions. NECK: Supple. No masses LUNGS: Clear to auscultation. No presence of rhonchi/wheezing/rales. Adequate chest expansion HEART: RRR, normal s1 and s2. ABDOMEN: Soft, nontender, no guarding, no peritoneal signs, and nondistended. BS +. No masses. EXTREMITIES: Without any cyanosis, clubbing, rash, lesions or edema. NEUROLOGIC: AOx3, no focal motor deficit. SKIN: no jaundice, no rashes  Imaging/Labs: as above  I personally reviewed and interpreted the available labs, imaging and endoscopic  files.  Impression and Plan: Caroline May is a 72 y.o. female with past medical history of HTN, urolithiasis, GERD, anxiety, asthma, coming for follow up of throat clearing.  Patient has presented recurrent episodes of throat clearing without red flag signs.  She is not presenting any typical symptoms of reflux but is concerned about the persistence of symptoms despite taking pantoprazole and famotidine compliantly.  She reports having a history of atopy/allergies in the past.  We discussed that her symptoms are not typical for GERD.  It is possible this could be LPR related but also related to upper airway issues.  She is not interested in increasing the medication for now but trying Nasacort and Zyrtec on a more regular basis.  If her symptoms were to persist she will increase her pantoprazole to twice a day dosing.  We discussed the possibility of performing a pH impedance testing but she is not interested in proceeding with this.  -Continue pantoprazole 40 mg qday -Increase the use of Nasacort to daily dosing -Continue Zyrtec If no improvement with this strategy, will need to increase pantoprazole to twice a day  All questions were answered.      Katrinka Blazing, MD Gastroenterology and Hepatology [facility]

## 2023-05-18 NOTE — Patient Instructions (Signed)
Continue pantoprazole 40 mg qday Increase the use of Nasacort to daily dosing Continue Zyrtec If no improvement with this strategy, will need to increase pantoprazole to twice a day

## 2023-06-27 ENCOUNTER — Other Ambulatory Visit (INDEPENDENT_AMBULATORY_CARE_PROVIDER_SITE_OTHER): Payer: Self-pay | Admitting: Gastroenterology

## 2023-06-29 NOTE — Telephone Encounter (Signed)
 Last seen 05/18/23

## 2023-07-31 ENCOUNTER — Telehealth (INDEPENDENT_AMBULATORY_CARE_PROVIDER_SITE_OTHER): Payer: Self-pay | Admitting: *Deleted

## 2023-07-31 NOTE — Telephone Encounter (Signed)
Patient seen on 05/18/23 for gerd. She is taking pantoprazole 40mg  in the mornings. She would like to cut her dose to 20mg  since not having any symptoms. If you agree she would like 20mg  script sent to [facility]. Her note and med list has she takes famotidine but she told me she was not taking that med.   [phone]

## 2023-08-03 ENCOUNTER — Other Ambulatory Visit (INDEPENDENT_AMBULATORY_CARE_PROVIDER_SITE_OTHER): Payer: Self-pay | Admitting: Gastroenterology

## 2023-08-03 DIAGNOSIS — K219 Gastro-esophageal reflux disease without esophagitis: Secondary | ICD-10-CM

## 2023-08-03 MED ORDER — PANTOPRAZOLE SODIUM 20 MG PO TBEC: 20.0000 mg | DELAYED_RELEASE_TABLET | Freq: Every day | ORAL | 3 refills | Status: DC

## 2023-08-03 NOTE — Telephone Encounter (Signed)
Ok to make change, I sent a prescription for pantoprazole 20 mg qday

## 2023-08-03 NOTE — Telephone Encounter (Signed)
Patient notified

## 2023-08-26 ENCOUNTER — Encounter (INDEPENDENT_AMBULATORY_CARE_PROVIDER_SITE_OTHER): Payer: Self-pay | Admitting: Gastroenterology

## 2023-09-21 ENCOUNTER — Ambulatory Visit (INDEPENDENT_AMBULATORY_CARE_PROVIDER_SITE_OTHER): Payer: Medicare Other | Admitting: Gastroenterology

## 2023-10-05 ENCOUNTER — Ambulatory Visit (INDEPENDENT_AMBULATORY_CARE_PROVIDER_SITE_OTHER): Payer: Medicare Other | Admitting: Gastroenterology

## 2023-10-24 IMAGING — DX DG CHEST 2V
2 series · 2 of 2 positions shown · non-contrast
Comparison: None.

CLINICAL DATA: Chest pain

EXAM:
CHEST - 2 VIEW

[chest pa]
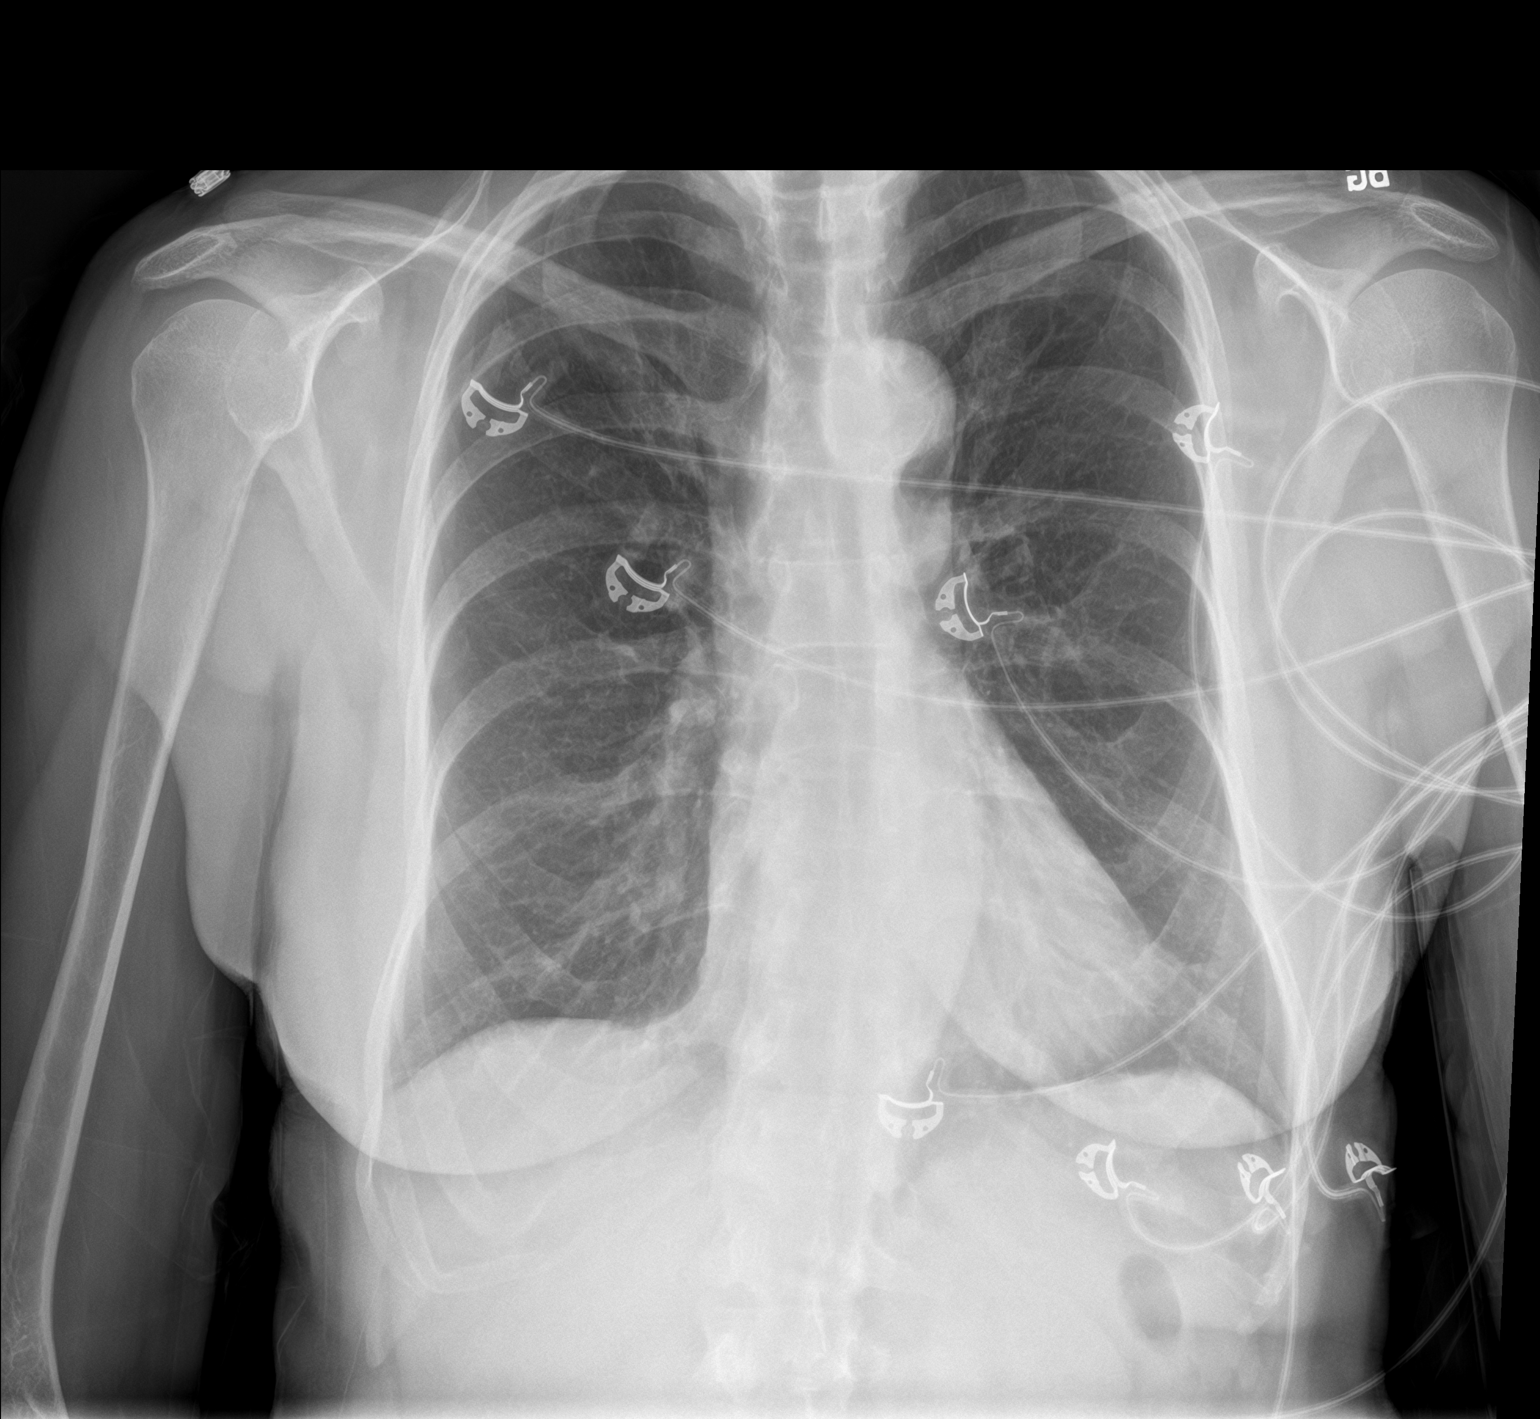

[chest lat]
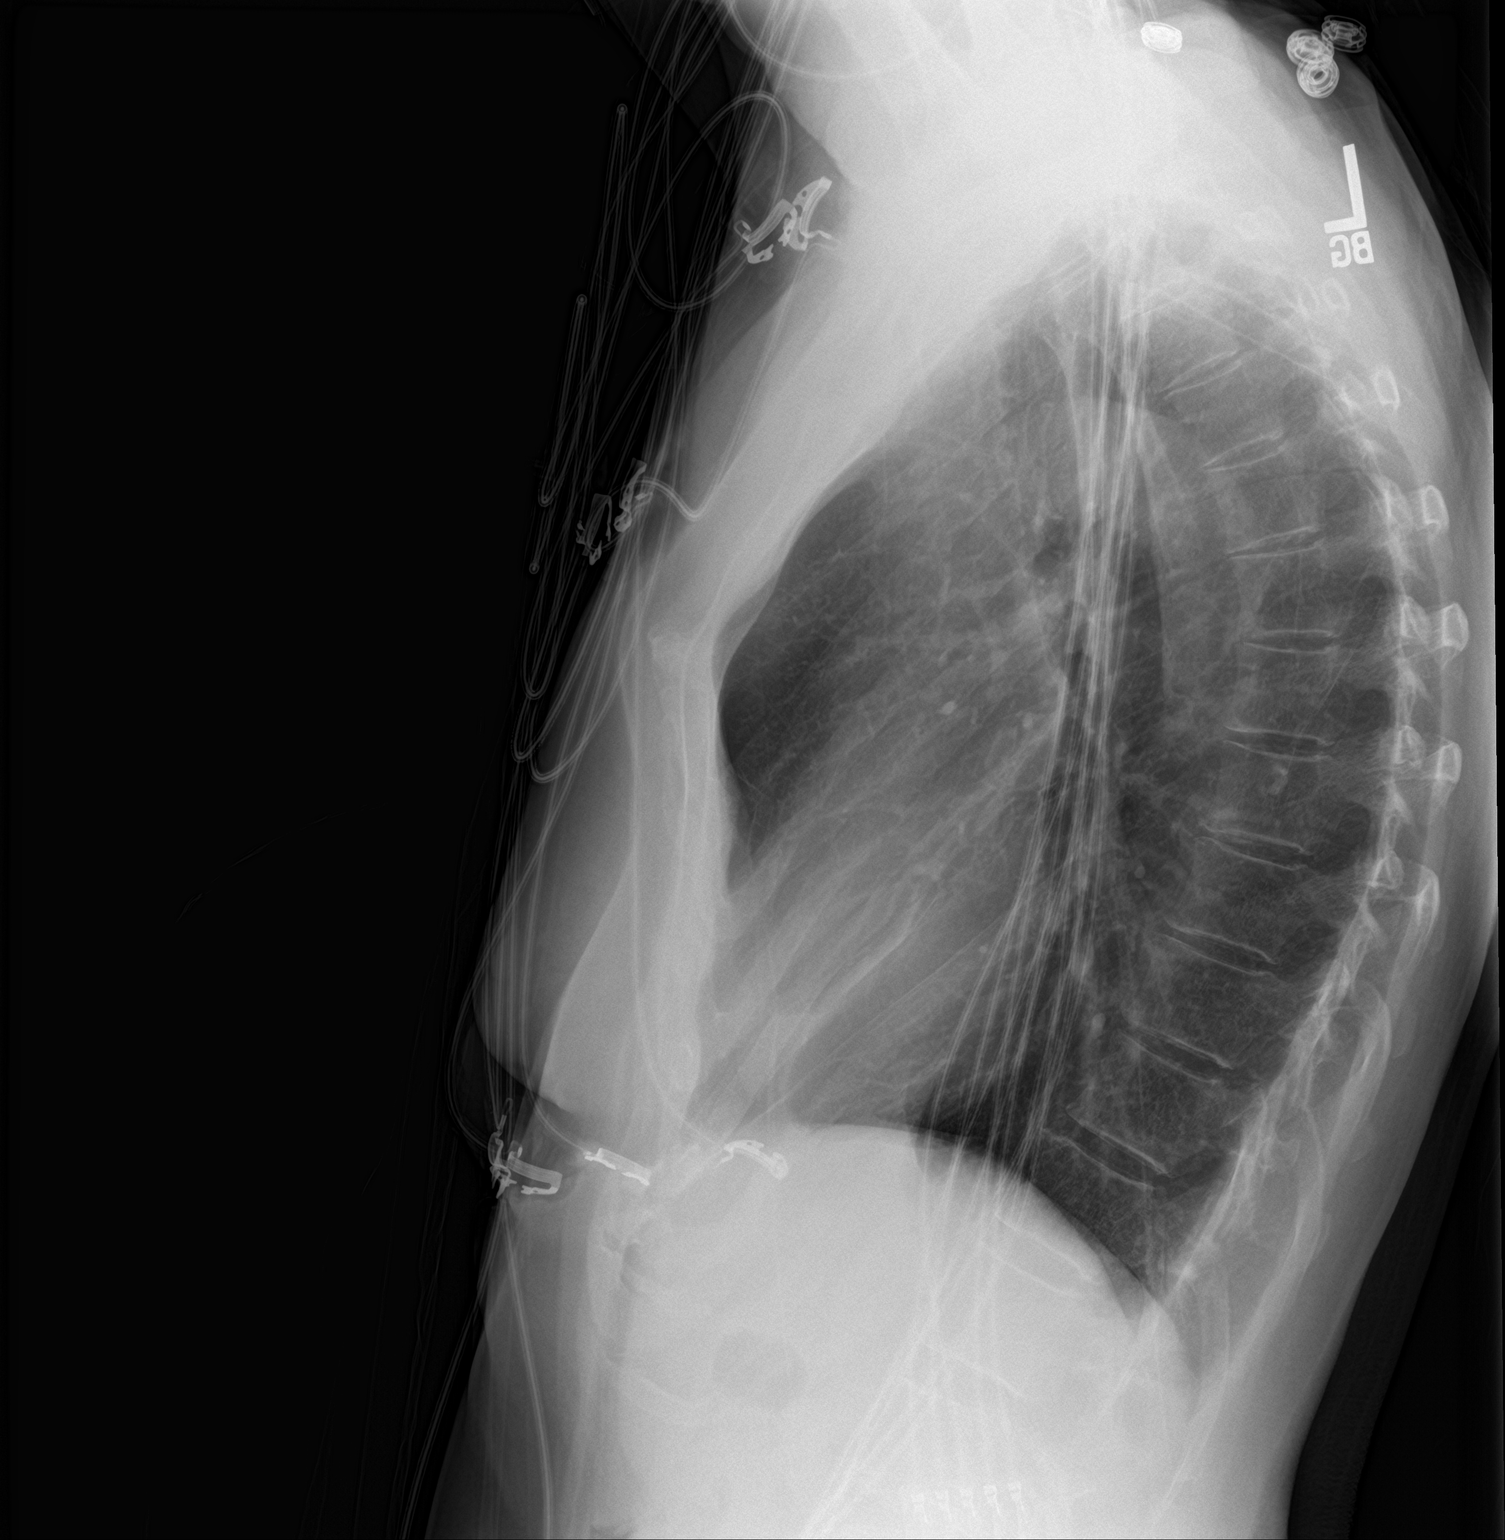

[2 of 2 positions shown; findings below may reference images not displayed]

FINDINGS: Lungs are clear.  No pleural effusion or pneumothorax.

The heart is normal in size.

Visualized osseous structures are within normal limits.
IMPRESSION: Normal chest radiographs.

## 2024-03-28 ENCOUNTER — Encounter (INDEPENDENT_AMBULATORY_CARE_PROVIDER_SITE_OTHER): Payer: Self-pay | Admitting: Gastroenterology

## 2024-03-28 ENCOUNTER — Ambulatory Visit (INDEPENDENT_AMBULATORY_CARE_PROVIDER_SITE_OTHER): Admitting: Gastroenterology

## 2024-03-28 DIAGNOSIS — K219 Gastro-esophageal reflux disease without esophagitis: Secondary | ICD-10-CM

## 2024-03-28 MED ORDER — PANTOPRAZOLE SODIUM 20 MG PO TBEC: 20.0000 mg | DELAYED_RELEASE_TABLET | Freq: Every day | ORAL | 2 refills | Status: AC

## 2024-03-28 NOTE — Patient Instructions (Addendum)
 Please take protonix  20mg  daily for atleast the next month, if feeling better you can try to step down to every other day dosing or PRN if having infrequent symptoms (only a few times per month). If you feel protonix  Is causing worsening constipation or burning in stomach, let me know and we can switch to a different PPI  Be mindful of greasy, spicy, fried, citrus foods, caffeine, carbonated drinks, chocolate and alcohol as these can increase reflux symptoms Stay upright 2-3 hours after eating, prior to lying down and avoid eating late in the evenings.  Follow up 6 months  It was a pleasure to see you today. I want to create trusting relationships with patients and provide genuine, compassionate, and quality care. I truly value your feedback! please be on the lookout for a survey regarding your visit with me today. I appreciate your input about our visit and your time in completing this!    Caroline Linhardt L. Abbygael Curtiss, MSN, APRN, AGNP-C Adult-Gerontology Nurse Practitioner [facility] G[facility]enterology at [facility]

## 2024-03-28 NOTE — Progress Notes (Addendum)
 Referring Provider: Pomposini, Amiel Balder, MD Primary Care Physician:  Wilhemena Harbour Primary GI Physician: Dr. Sammi Crick   Chief Complaint  Patient presents with   Follow-up    Patient here today for a follow up on Gerd. Patient is taking Pantoprazole  20 mg once per day, which she just resumed on this past weekend. This has improved her burning sensation since resuming.    HPI:   Caroline May is a 73 y.o. female with past medical history of HTN, urolithiasis, GERD, anxiety, asthma   Patient presenting today for:  GERD follow up  Last seen July 2024, at that time had some constant throat clearing, given recent antibiotics, felt some phlegm in throat. Recently seen by ENT, given famotidine 20mg  at bedtime without much improvement   Recommended continue protonix  40mg  daily, increase nasacort, continue zyrtec, consider PPI BID if symptoms not improving  Present:  Patient states that she had weaned herself off protonix  and had been off for about 5 months. She reports recently she ate a lot of dried fruit and started flaring up with more reflux and acid coming up. She restarted her protonix  20mg  daily and has had improvement in symptoms. She felt that protonix  was constipating her previously so this is part of the reason she wanted to stop. She also notes some burning in her stomach when she takes the protonix . She does not note any burning in stomach with other food or pills. Denies any dysphagia, nausea, vomiting, weight loss, early satiety, melena, rectal bleeding.   She has been on nexium and omeprazole  in the past. She does not think that omeprazole  was the cause of her swelling in the past as she was also starting lisinopril and thinks it came from that.   She notes she feels yogurt constipates her more as well. She is trying to lose some weight so trying to watch her diet. She feels okay taking her protonix  this morning. Eating prunes and trying to drink plenty of water  for her  constipation    EGD 02/21/22 1 cm hiatal hernia. - Normal stomach. - Mucosal nodule found in the duodenum. Biopsied-tubular adenoma   EGD 04/09/22- 2 cm hiatal hernia. - Normal stomach. Biopsied.  Pathology showed reactive gastropathy without H. pylori. - Mass (suspected adenoma) in the second portion of the duodenum. Removed with cold snare - path tubular adenoma.   Last Colonoscopy:01/03/22 - The entire examined colon is normal. - Non-bleeding internal hemorrhoids. - No specimens collected.   Filed Weights   03/28/24 0920  Weight: 144 lb 14.4 oz (65.7 kg)     Past Medical History:  Diagnosis Date   Anxiety    Asthma    GERD (gastroesophageal reflux disease)    History of kidney stones    Hypertension     Past Surgical History:  Procedure Laterality Date   APPENDECTOMY     BIOPSY  02/21/2022   Procedure: BIOPSY;  Surgeon: Urban Garden, MD;  [facility];  Service: Gastroenterology;;   BIOPSY  04/09/2022   Procedure: BIOPSY;  Surgeon: Urban Garden, MD;  [facility];  Service: Gastroenterology;;   COLONOSCOPY N/A 10/08/2016   Procedure: COLONOSCOPY;  Surgeon: Ruby Corporal, MD;  [facility];  Service: Endoscopy;  Laterality: N/A;  930   COLONOSCOPY WITH PROPOFOL  N/A 01/03/2022   Procedure: COLONOSCOPY WITH PROPOFOL ;  Surgeon: Urban Garden, MD;  [facility];  Service: Gastroenterology;  Laterality: N/A;  730 ASA  1   ESOPHAGOGASTRODUODENOSCOPY (EGD) WITH PROPOFOL  N/A 02/21/2022   Procedure: ESOPHAGOGASTRODUODENOSCOPY (EGD) WITH PROPOFOL ;  Surgeon: Urban Garden, MD;  [facility];  Service: Gastroenterology;  Laterality: N/A;  220, per Hugo Maes pt moved up and knows new time   ESOPHAGOGASTRODUODENOSCOPY (EGD) WITH PROPOFOL  N/A 04/09/2022   Procedure: ESOPHAGOGASTRODUODENOSCOPY (EGD) WITH PROPOFOL ;  Surgeon: Urban Garden, MD;  [facility];  Service:  Gastroenterology;  Laterality: N/A;  10:00   POLYPECTOMY  04/09/2022   Procedure: POLYPECTOMY;  Surgeon: Urban Garden, MD;  [facility];  Service: Gastroenterology;;   TONSILLECTOMY     TUBAL LIGATION  1984    Current Outpatient Medications  Medication Sig Dispense Refill   acetaminophen (TYLENOL) 325 MG tablet Take by mouth. Takes as needed     atorvastatin (LIPITOR) 10 MG tablet Take 20 mg by mouth at bedtime.     Biotin 10 MG CHEW Chew by mouth daily at 6 (six) AM.     carboxymethylcellulose (REFRESH PLUS) 0.5 % SOLN Place 1-2 drops into both eyes 3 (three) times daily as needed (dry/irritated eyes.).     cetirizine (ZYRTEC) 10 MG tablet Take 10 mg by mouth daily as needed for allergies.     Cholecalciferol (VITAMIN D) 2000 units tablet Take 2,000 Units by mouth daily with lunch.     LORazepam (ATIVAN) 0.5 MG tablet Take 0.5 mg by mouth daily as needed for anxiety.     losartan (COZAAR) 100 MG tablet Take 100 mg by mouth at bedtime.     Multiple Vitamins-Minerals (MULTIVITAMIN WITH MINERALS) tablet Take 1 tablet by mouth daily with lunch.     pantoprazole  (PROTONIX ) 20 MG tablet Take 1 tablet (20 mg total) by mouth daily. 90 tablet 3   Probiotic Product (PROBIOTIC DAILY PO) Take by mouth. Superior probiotic 3% prebiotic one daily.     triamcinolone (NASACORT) 55 MCG/ACT AERO nasal inhaler Place 2 sprays into the nose daily as needed (allergies).     famotidine (PEPCID) 20 MG tablet Take 20 mg by mouth at bedtime. (Patient not taking: Reported on 03/28/2024)     No current facility-administered medications for this visit.    Allergies as of 03/28/2024 - Review Complete 03/28/2024  Allergen Reaction Noted   Lisinopril Swelling 01/27/2022   Omeprazole  Swelling 04/15/2022   Sulfa antibiotics Other (See Comments) 10/03/2016   Azithromycin Palpitations 10/23/2021    Social History   Socioeconomic History   Marital status: Married    Spouse name: Not on file    Number of children: Not on file   Years of education: Not on file   Highest education level: Not on file  Occupational History   Not on file  Tobacco Use   Smoking status: Never    Passive exposure: Past   Smokeless tobacco: Never  Vaping Use   Vaping status: Never Used  Substance and Sexual Activity   Alcohol use: Not Currently    Comment: occasional glass of wine   Drug use: No   Sexual activity: Not on file  Other Topics Concern   Not on file  Social History Narrative   Not on file   Social Drivers of Health   Financial Resource Strain: Not on file  Food Insecurity: Not on file  Transportation Needs: Not on file  Physical Activity: Not on file  Stress: Not on file  Social Connections: Not on file    Review of systems General: negative for malaise, night sweats,  fever, chills, weight loss Neck: Negative for lumps, goiter, pain and significant neck swelling Resp: Negative for cough, wheezing, dyspnea at rest CV: Negative for chest pain, leg swelling, palpitations, orthopnea GI: denies melena, hematochezia, nausea, vomiting, diarrhea, dysphagia, odyonophagia, early satiety or unintentional weight loss. +reflux +constipation  The remainder of the review of systems is noncontributory.  Physical Exam: BP 134/74 (BP Location: Left Arm, Patient Position: Sitting, Cuff Size: Normal)   Pulse 62   Temp (!) 97.5 F (36.4 C) (Temporal)   Ht 5\' 4"  (1.626 m)   Wt 144 lb 14.4 oz (65.7 kg)   BMI 24.87 kg/m  General:   Alert and oriented. No distress noted. Pleasant and cooperative.  Head:  Normocephalic and atraumatic. Eyes:  Conjuctiva clear without scleral icterus. Mouth:  Oral mucosa pink and moist. Good dentition. No lesions. Heart: Normal rate and rhythm, s1 and s2 heart sounds present.  Lungs: Clear lung sounds in all lobes. Respirations equal and unlabored. Abdomen:  +BS, soft, non-tender and non-distended. No rebound or guarding. No HSM or masses noted. Neurologic:   Alert and  oriented x4 Psych:  Alert and cooperative. Normal mood and affect.  Invalid input(s): "6 MONTHS"   ASSESSMENT: ADELAYDE MINNEY is a 73 y.o. female presenting today for GERD follow up  Had previously weaned off of protonix  and was feeling good but noted flare of reflux symptoms after consuming a lot of dried fruit. Back on protonix  20mg  daily which seems to have improved her reflux. Notes more constipation on protonix , some burning in her stomach which she is unsure if this is related to PPI or not. Reassuringly she has no alarm symptoms. At this time, will continue with protonix  20mg  daily for the next month, if she is feeling good, can try stepping down to every other day dosing, or even PRN if symptoms only occurring a couple of times per month or less. Should continue with good reflux precautions, good water  intake, prunes for constipation. She will make me aware if constipation persists, may consider changing to another PPI if she is requiring this more regularly.    PLAN:  -continue protonix  20mg  daily for the next month, can try stepping down to every other day of PRN if symptoms occurring infrequently  -good reflux precautions -good water  intake, prunes for constipation -pt to make me aware if she continues to have worsening constipation on protonix , consider change in PPI therapy if needing PPI more regularly   All questions were answered, patient verbalized understanding and is in agreement with plan as outlined above.   Follow Up: 6 months   Sarika Baldini L. Adrien Alberta, MSN, APRN, AGNP-C Adult-Gerontology Nurse Practitioner [facility]  I have reviewed the note and agree with the APP's assessment as described in this progress note  Samantha Cress, MD Gastroenterology and Hepatology [facility]

## 2024-09-08 ENCOUNTER — Encounter (INDEPENDENT_AMBULATORY_CARE_PROVIDER_SITE_OTHER): Payer: Self-pay | Admitting: Gastroenterology

## 2024-09-14 ENCOUNTER — Encounter (INDEPENDENT_AMBULATORY_CARE_PROVIDER_SITE_OTHER): Payer: Self-pay | Admitting: Gastroenterology

## 2024-10-20 ENCOUNTER — Encounter (INDEPENDENT_AMBULATORY_CARE_PROVIDER_SITE_OTHER): Payer: Self-pay | Admitting: Gastroenterology

## 2024-10-20 ENCOUNTER — Ambulatory Visit (INDEPENDENT_AMBULATORY_CARE_PROVIDER_SITE_OTHER): Admitting: Gastroenterology

## 2024-10-20 VITALS — BP 144/77 | HR 66 | Temp 97.9°F | Ht 64.0 in | Wt 150.4 lb

## 2024-10-20 DIAGNOSIS — K219 Gastro-esophageal reflux disease without esophagitis: Secondary | ICD-10-CM

## 2024-10-20 NOTE — Patient Instructions (Signed)
 Continue with Protonix  as needed for heartburn

## 2024-10-20 NOTE — Progress Notes (Signed)
 Caroline May, M.D. Gastroenterology & Hepatology [facility] [address] [place], [place] [address]  Primary Care Physician: Elliott, Dianne E [address] [place] [id number]  I will communicate my assessment and recommendations to the referring MD via EMR.  Problems: GERD  History of Present Illness: Caroline May is a 73 y.o. female with past medical history of HTN, urolithiasis, GERD, anxiety, asthma coming for follow-up of GERD.  The patient was last seen on 03/28/2024. At that time, the patient was continued on Protonix  20 mg daily.  Patient has been taking PPI as needed only. She may have bouts of heartburn spaced by several months, for which she takes a short course of PPI, which controls her symptoms. Thinks bouts of GERD could be related to spicy food intake. No dysphagia or odynophagia.  The patient denies having any nausea, vomiting, fever, chills, hematochezia, melena, hematemesis, abdominal distention, abdominal pain, diarrhea, jaundice, pruritus . Gained some lb.   Last EGD: 04/09/22 -A 2 cm hiatal hernia was present.-The entire examined stomach was normal. Biopsies were taken with a cold forceps for Helicobacter pylori testing.-A small sessile mass, consistent with adenoma, with no bleeding was found in the second portion of the duodenum. The polyp was removed with a cold snare. Resection and retrieval were complete.  Path: A. SMALL BOWEL, POLYPECTOMY:  - Duodenal adenoma (low-grade dysplasia)  - Negative for high-grade dysplasia or carcinoma   B. STOMACH, BIOPSY:  - Gastric antral mucosa with mild nonspecific reactive gastropathy  - Gastric oxyntic mucosa with no specific histopathologic changes  - Helicobacter pylori-like organisms are not identified on routine HE  stain   The exam of the duodenum was otherwise normal.  Last Colonoscopy: 01/03/22 - The entire examined colon is normal. - Non- bleeding internal  hemorrhoids. - No specimens collected. Recommend a repeat colonoscopy in 5 years  Past Medical History: Past Medical History:  Diagnosis Date   Anxiety    Asthma    GERD (gastroesophageal reflux disease)    History of kidney stones    Hypertension     Past Surgical History: Past Surgical History:  Procedure Laterality Date   APPENDECTOMY     BIOPSY  02/21/2022   Procedure: BIOPSY;  Surgeon: May Angelia Toribio, MD;  [facility];  Service: Gastroenterology;;   BIOPSY  04/09/2022   Procedure: BIOPSY;  Surgeon: May Angelia Toribio, MD;  [facility];  Service: Gastroenterology;;   COLONOSCOPY N/A 10/08/2016   Procedure: COLONOSCOPY;  Surgeon: Claudis RAYMOND Rivet, MD;  [facility];  Service: Endoscopy;  Laterality: N/A;  930   COLONOSCOPY WITH PROPOFOL  N/A 01/03/2022   Procedure: COLONOSCOPY WITH PROPOFOL ;  Surgeon: May Angelia Toribio, MD;  [facility];  Service: Gastroenterology;  Laterality: N/A;  730 ASA 1   ESOPHAGOGASTRODUODENOSCOPY (EGD) WITH PROPOFOL  N/A 02/21/2022   Procedure: ESOPHAGOGASTRODUODENOSCOPY (EGD) WITH PROPOFOL ;  Surgeon: May Angelia Toribio, MD;  [facility];  Service: Gastroenterology;  Laterality: N/A;  220, per Anette Caldron pt moved up and knows new time   ESOPHAGOGASTRODUODENOSCOPY (EGD) WITH PROPOFOL  N/A 04/09/2022   Procedure: ESOPHAGOGASTRODUODENOSCOPY (EGD) WITH PROPOFOL ;  Surgeon: May Angelia Toribio, MD;  [facility];  Service: Gastroenterology;  Laterality: N/A;  10:00   POLYPECTOMY  04/09/2022   Procedure: POLYPECTOMY;  Surgeon: May Angelia Toribio, MD;  [facility];  Service: Gastroenterology;;   TONSILLECTOMY     TUBAL LIGATION  1984  Family History: Family History  Problem Relation Age of Onset   Heart attack Mother    Colon cancer Father    Heart attack Father    Breast cancer Sister    Alzheimer's disease Sister    Parkinson's  disease Brother     Social History:Tobacco Use History[1] Social History   Substance and Sexual Activity  Alcohol Use Not Currently   Comment: occasional glass of wine   Social History   Substance and Sexual Activity  Drug Use No    Allergies: Allergies[2]  Medications: Current Outpatient Medications  Medication Sig Dispense Refill   acetaminophen (TYLENOL) 325 MG tablet Take by mouth. Takes as needed     atorvastatin (LIPITOR) 10 MG tablet Take 20 mg by mouth at bedtime.     Biotin 10 MG CHEW Chew by mouth daily at 6 (six) AM.     carboxymethylcellulose (REFRESH PLUS) 0.5 % SOLN Place 1-2 drops into both eyes 3 (three) times daily as needed (dry/irritated eyes.).     cetirizine (ZYRTEC) 10 MG tablet Take 10 mg by mouth daily as needed for allergies.     Cholecalciferol (VITAMIN D) 2000 units tablet Take 2,000 Units by mouth daily with lunch.     LORazepam (ATIVAN) 0.5 MG tablet Take 0.5 mg by mouth daily as needed for anxiety.     losartan (COZAAR) 100 MG tablet Take 100 mg by mouth at bedtime.     Multiple Vitamins-Minerals (MULTIVITAMIN WITH MINERALS) tablet Take 1 tablet by mouth daily with lunch.     pantoprazole  (PROTONIX ) 20 MG tablet Take 1 tablet (20 mg total) by mouth daily. (Patient taking differently: Take 20 mg by mouth as needed.) 30 tablet 2   Probiotic Product (PROBIOTIC DAILY PO) Take by mouth. Superior probiotic 3% prebiotic one daily.     triamcinolone (NASACORT) 55 MCG/ACT AERO nasal inhaler Place 2 sprays into the nose daily as needed (allergies).     No current facility-administered medications for this visit.    Review of Systems: GENERAL: negative for malaise, night sweats HEENT: No changes in hearing or vision, no nose bleeds or other nasal problems. NECK: Negative for lumps, goiter, pain and significant neck swelling RESPIRATORY: Negative for cough, wheezing CARDIOVASCULAR: Negative for chest pain, leg swelling, palpitations, orthopnea GI: SEE  HPI MUSCULOSKELETAL: Negative for joint pain or swelling, back pain, and muscle pain. SKIN: Negative for lesions, rash PSYCH: Negative for sleep disturbance, mood disorder and recent psychosocial stressors. HEMATOLOGY Negative for prolonged bleeding, bruising easily, and swollen nodes. ENDOCRINE: Negative for cold or heat intolerance, polyuria, polydipsia and goiter. NEURO: negative for tremor, gait imbalance, syncope and seizures. The remainder of the review of systems is noncontributory.   Physical Exam: BP (!) 144/77 (BP Location: Right Arm, Patient Position: Sitting, Cuff Size: Normal)   Pulse 66   Temp 97.9 F (36.6 C) (Oral)   Ht 5' 4 (1.626 m)   Wt 150 lb 6.4 oz (68.2 kg)   BMI 25.82 kg/m  GENERAL: The patient is AO x3, in no acute distress. HEENT: Head is normocephalic and atraumatic. EOMI are intact. Mouth is well hydrated and without lesions. NECK: Supple. No masses LUNGS: Clear to auscultation. No presence of rhonchi/wheezing/rales. Adequate chest expansion HEART: RRR, normal s1 and s2. ABDOMEN: Soft, nontender, no guarding, no peritoneal signs, and nondistended. BS +. No masses. EXTREMITIES: Without any cyanosis, clubbing, rash, lesions or edema. NEUROLOGIC: AOx3, no focal motor deficit. SKIN: no jaundice, no rashes  Imaging/Labs: as above  I  personally reviewed and interpreted the available labs, imaging and endoscopic files.  Impression and Plan: Caroline May is a 73 y.o. female with past medical history of HTN, urolithiasis, GERD, anxiety, asthma coming for follow-up of GERD.  Patient has presented adequate control of her GERD while taking Protonix  as needed as she is presenting with very seldom episodes of GERD.  No other associated symptoms.  She can continue taking this PPI as needed.  -Continue with Protonix  20 mg as needed for heartburn  All questions were answered.      Caroline Fortune, MD Gastroenterology and Hepatology [facility]  Gastroenterology     [1]  Social History Tobacco Use  Smoking Status Never   Passive exposure: Past  Smokeless Tobacco Never  [2]  Allergies Allergen Reactions   Lisinopril Swelling   Omeprazole  Swelling    Per patient only the Omeprazole  40 mg's does this.    Sulfa Antibiotics Other (See Comments)    Causes the inside of mouth to become irritated    Azithromycin Palpitations    Raised blood pressure
